# Patient Record
Sex: Male | Born: 1955
Health system: Southern US, Community
[De-identification: ages and names within clinical notes are randomized; demographics above are authoritative.]

## PROBLEM LIST (undated history)

## (undated) ENCOUNTER — Emergency Department (HOSPITAL_COMMUNITY): Admission: EM | Payer: Self-pay | Source: Home / Self Care

## (undated) DIAGNOSIS — E119 Type 2 diabetes mellitus without complications: Secondary | ICD-10-CM

## (undated) DIAGNOSIS — I10 Essential (primary) hypertension: Secondary | ICD-10-CM

## (undated) DIAGNOSIS — E7800 Pure hypercholesterolemia, unspecified: Secondary | ICD-10-CM

---

## 1998-08-29 ENCOUNTER — Encounter: Payer: Self-pay | Admitting: Family Medicine

## 1998-08-29 ENCOUNTER — Ambulatory Visit (HOSPITAL_COMMUNITY): Admission: RE | Admit: 1998-08-29 | Discharge: 1998-08-29 | Payer: Self-pay | Admitting: Family Medicine

## 2002-04-11 ENCOUNTER — Encounter: Admission: RE | Admit: 2002-04-11 | Discharge: 2002-07-10 | Payer: Self-pay | Admitting: Family Medicine

## 2003-01-26 ENCOUNTER — Emergency Department (HOSPITAL_COMMUNITY): Admission: EM | Admit: 2003-01-26 | Discharge: 2003-01-26 | Payer: Self-pay | Admitting: Emergency Medicine

## 2003-10-09 ENCOUNTER — Ambulatory Visit (HOSPITAL_COMMUNITY): Admission: RE | Admit: 2003-10-09 | Discharge: 2003-10-09 | Payer: Self-pay | Admitting: Family Medicine

## 2003-10-30 ENCOUNTER — Ambulatory Visit (HOSPITAL_COMMUNITY): Admission: RE | Admit: 2003-10-30 | Discharge: 2003-10-30 | Payer: Self-pay | Admitting: Family Medicine

## 2014-08-02 ENCOUNTER — Encounter (HOSPITAL_COMMUNITY): Payer: Self-pay | Admitting: Emergency Medicine

## 2014-08-02 ENCOUNTER — Emergency Department (HOSPITAL_COMMUNITY)
Admission: EM | Admit: 2014-08-02 | Discharge: 2014-08-02 | Disposition: A | Discharge status: Home / Self Care | Payer: BLUE CROSS/BLUE SHIELD | Attending: Emergency Medicine | Admitting: Emergency Medicine

## 2014-08-02 ENCOUNTER — Emergency Department (HOSPITAL_COMMUNITY): Payer: BLUE CROSS/BLUE SHIELD

## 2014-08-02 DIAGNOSIS — E119 Type 2 diabetes mellitus without complications: Secondary | ICD-10-CM | POA: Diagnosis not present

## 2014-08-02 DIAGNOSIS — R109 Unspecified abdominal pain: Secondary | ICD-10-CM | POA: Diagnosis present

## 2014-08-02 DIAGNOSIS — R1110 Vomiting, unspecified: Secondary | ICD-10-CM

## 2014-08-02 DIAGNOSIS — R112 Nausea with vomiting, unspecified: Secondary | ICD-10-CM | POA: Diagnosis not present

## 2014-08-02 DIAGNOSIS — I10 Essential (primary) hypertension: Secondary | ICD-10-CM | POA: Insufficient documentation

## 2014-08-02 DIAGNOSIS — R197 Diarrhea, unspecified: Secondary | ICD-10-CM | POA: Diagnosis not present

## 2014-08-02 DIAGNOSIS — D72829 Elevated white blood cell count, unspecified: Secondary | ICD-10-CM | POA: Diagnosis not present

## 2014-08-02 DIAGNOSIS — R1084 Generalized abdominal pain: Secondary | ICD-10-CM | POA: Insufficient documentation

## 2014-08-02 DIAGNOSIS — R000 Tachycardia, unspecified: Secondary | ICD-10-CM | POA: Diagnosis not present

## 2014-08-02 HISTORY — DX: Essential (primary) hypertension: I10

## 2014-08-02 HISTORY — DX: Type 2 diabetes mellitus without complications: E11.9

## 2014-08-02 HISTORY — DX: Pure hypercholesterolemia, unspecified: E78.00

## 2014-08-02 LAB — COMPREHENSIVE METABOLIC PANEL
ALBUMIN: 4.8 g/dL (ref 3.5–5.0)
ALK PHOS: 56 U/L (ref 38–126)
ALT: 30 U/L (ref 17–63)
AST: 23 U/L (ref 15–41)
Anion gap: 17 — ABNORMAL HIGH (ref 5–15)
BILIRUBIN TOTAL: 1.8 mg/dL — AB (ref 0.3–1.2)
BUN: 27 mg/dL — ABNORMAL HIGH (ref 6–20)
CO2: 19 mmol/L — ABNORMAL LOW (ref 22–32)
Calcium: 9.3 mg/dL (ref 8.9–10.3)
Chloride: 101 mmol/L (ref 101–111)
Creatinine, Ser: 0.8 mg/dL (ref 0.61–1.24)
GFR calc Af Amer: >60 mL/min (ref >60)
GLUCOSE: 260 mg/dL — AB (ref 65–99)
Potassium: 3.3 mmol/L — ABNORMAL LOW (ref 3.5–5.1)
SODIUM: 137 mmol/L (ref 135–145)
Total Protein: 7.9 g/dL (ref 6.5–8.1)

## 2014-08-02 LAB — CBC WITH DIFFERENTIAL/PLATELET
Basophils Absolute: 0 10*3/uL (ref 0.0–0.1)
Basophils Relative: 0 % (ref 0–1)
EOS PCT: 0 % (ref 0–5)
Eosinophils Absolute: 0 10*3/uL (ref 0.0–0.7)
HEMATOCRIT: 42.5 % (ref 39.0–52.0)
Hemoglobin: 15.4 g/dL (ref 13.0–17.0)
LYMPHS PCT: 3 % — AB (ref 12–46)
Lymphs Abs: 0.6 10*3/uL — ABNORMAL LOW (ref 0.7–4.0)
MCH: 28.6 pg (ref 26.0–34.0)
MCHC: 36.2 g/dL — ABNORMAL HIGH (ref 30.0–36.0)
MCV: 79 fL (ref 78.0–100.0)
MONO ABS: 0.5 10*3/uL (ref 0.1–1.0)
Monocytes Relative: 3 % (ref 3–12)
NEUTROS ABS: 19 10*3/uL — AB (ref 1.7–7.7)
Neutrophils Relative %: 94 % — ABNORMAL HIGH (ref 43–77)
Platelets: 259 10*3/uL (ref 150–400)
RBC: 5.38 MIL/uL (ref 4.22–5.81)
RDW: 13.7 % (ref 11.5–15.5)
WBC: 20.1 10*3/uL — AB (ref 4.0–10.5)

## 2014-08-02 LAB — I-STAT TROPONIN, ED
Troponin i, poc: 0.01 ng/mL (ref 0.00–0.08)
Troponin i, poc: 0.01 ng/mL (ref 0.00–0.08)

## 2014-08-02 LAB — I-STAT CG4 LACTIC ACID, ED
LACTIC ACID, VENOUS: 1.66 mmol/L (ref 0.5–2.0)
Lactic Acid, Venous: 2.51 mmol/L (ref 0.5–2.0)

## 2014-08-02 LAB — CBG MONITORING, ED
Glucose-Capillary: 226 mg/dL — ABNORMAL HIGH (ref 65–99)
Glucose-Capillary: 171 mg/dL — ABNORMAL HIGH (ref 65–99)

## 2014-08-02 LAB — LIPASE, BLOOD: LIPASE: 22 U/L (ref 22–51)

## 2014-08-02 MED ORDER — SODIUM CHLORIDE 0.9 % IV BOLUS (SEPSIS)
1000.0000 mL | Freq: Once | INTRAVENOUS | Status: AC
  Administered 2014-08-02: 1000 mL via INTRAVENOUS

## 2014-08-02 MED ORDER — ONDANSETRON HCL 4 MG PO TABS: 4.0000 mg | ORAL_TABLET | Freq: Four times a day (QID) | ORAL | Status: DC

## 2014-08-02 MED ORDER — ONDANSETRON HCL 4 MG/2ML IJ SOLN
4.0000 mg | Freq: Once | INTRAMUSCULAR | Status: AC
  Administered 2014-08-02: 4 mg via INTRAVENOUS
  Filled 2014-08-02: qty 2

## 2014-08-02 MED ORDER — IOHEXOL 300 MG/ML  SOLN
100.0000 mL | Freq: Once | INTRAMUSCULAR | Status: AC | PRN
  Administered 2014-08-02: 100 mL via INTRAVENOUS

## 2014-08-02 MED ORDER — IOHEXOL 300 MG/ML  SOLN
50.0000 mL | Freq: Once | INTRAMUSCULAR | Status: AC | PRN
  Administered 2014-08-02: 50 mL via ORAL

## 2014-08-02 MED ORDER — HYDROMORPHONE HCL 1 MG/ML IJ SOLN
1.0000 mg | Freq: Once | INTRAMUSCULAR | Status: AC
  Administered 2014-08-02: 1 mg via INTRAVENOUS
  Filled 2014-08-02: qty 1

## 2014-08-02 MED ORDER — HYDROCODONE-ACETAMINOPHEN 5-325 MG PO TABS: 1.0000 | ORAL_TABLET | Freq: Four times a day (QID) | ORAL | Status: DC | PRN

## 2014-08-02 NOTE — ED Notes (Signed)
Family at bedside. 

## 2014-08-02 NOTE — ED Provider Notes (Signed)
CSN: [id number]     Arrival date & time 08/02/14  1209 History   First MD Initiated Contact with Patient 08/02/14 1228     Chief Complaint  Patient presents with  . Abdominal Pain  . Diarrhea     Patient is a 59 y.o. male presenting with abdominal pain and diarrhea. The history is provided by the patient. No language interpreter was used.  Abdominal Pain Associated symptoms: diarrhea   Diarrhea Associated symptoms: abdominal pain    Mr. Cornia presents for evaluation of abdominal pain, vomiting, diarrhea. Symptoms started at 6 AM this morning. They'll started some time. He reports multiple episodes of diarrhea, 6+. His associated nausea, vomiting, dry heaves. He has diffuse crampy abdominal pain. No history of similar previous episodes. He is an occasional drinker. History of hypertension and diabetes. Symptoms are severe, constant, worsening.  Past Medical History  Diagnosis Date  . Diabetes mellitus without complication   . Hypertension   . Hypercholesterolemia    History reviewed. No pertinent past surgical history. History reviewed. No pertinent family history. History  Substance Use Topics  . Smoking status: Never Smoker   . Smokeless tobacco: Not on file  . Alcohol Use: No    Review of Systems  Gastrointestinal: Positive for abdominal pain and diarrhea.  All other systems reviewed and are negative.     Allergies  Review of patient's allergies indicates no known allergies.  Home Medications   Prior to Admission medications   Not on File   BP 150/71 mmHg  Pulse 120  Temp(Src) 98.4 F (36.9 C) (Oral)  Resp 18  SpO2 100% Physical Exam  Constitutional: He is oriented to person, place, and time. He appears well-developed and well-nourished. He appears distressed.  HENT:  Head: Normocephalic and atraumatic.  Cardiovascular: Regular rhythm.   tachycardic  Pulmonary/Chest: Effort normal. No respiratory distress.  Abdominal: Soft.  Moderate diffuse abdominal  tenderness  Musculoskeletal: He exhibits no edema or tenderness.  Neurological: He is alert and oriented to person, place, and time.  Skin: Skin is warm and dry.  Psychiatric: He has a normal mood and affect. His behavior is normal.  Nursing note and vitals reviewed.   ED Course  Procedures (including critical care time) Labs Review Labs Reviewed  COMPREHENSIVE METABOLIC PANEL - Abnormal; Notable for the following:    Potassium 3.3 (*)    CO2 19 (*)    Glucose, Bld 260 (*)    BUN 27 (*)    Total Bilirubin 1.8 (*)    Anion gap 17 (*)    All other components within normal limits  CBC WITH DIFFERENTIAL/PLATELET - Abnormal; Notable for the following:    WBC 20.1 (*)    MCHC 36.2 (*)    Neutrophils Relative % 94 (*)    Neutro Abs 19.0 (*)    Lymphocytes Relative 3 (*)    Lymphs Abs 0.6 (*)    All other components within normal limits  I-STAT CG4 LACTIC ACID, ED - Abnormal; Notable for the following:    Lactic Acid, Venous 2.51 (*)    All other components within normal limits  CBG MONITORING, ED - Abnormal; Notable for the following:    Glucose-Capillary 226 (*)    All other components within normal limits  CBG MONITORING, ED - Abnormal; Notable for the following:    Glucose-Capillary 171 (*)    All other components within normal limits  LIPASE, BLOOD  I-STAT TROPOININ, ED  I-STAT CG4 LACTIC ACID, ED  I-STAT  TROPOININ, ED    Imaging Review Ct Abdomen Pelvis W Contrast  08/02/2014   CLINICAL DATA:  59 year old male with generalized abdominal pain, nausea and vomiting since 06:30 this morning.  EXAM: CT ABDOMEN AND PELVIS WITH CONTRAST  TECHNIQUE: Multidetector CT imaging of the abdomen and pelvis was performed using the standard protocol following bolus administration of intravenous contrast.  CONTRAST:  68mL OMNIPAQUE IOHEXOL 300 MG/ML SOLN, OMNIPAQUE IOHEXOL 300 MG/ML SOLN  COMPARISON:  No priors.  FINDINGS: Lower chest: Atherosclerotic calcifications in the left  circumflex and right coronary arteries.  Hepatobiliary: Mild diffuse decreased attenuation throughout the hepatic parenchyma, suggestive of hepatic steatosis (difficult to say for certain on today's noncontrast CT examination). No discrete cystic or solid hepatic lesions. No intra or extrahepatic biliary ductal dilatation. Gallbladder is normal in appearance.  Pancreas: No pancreatic mass. No pancreatic ductal dilatation. No pancreatic or peripancreatic fluid or inflammatory changes.  Spleen: Unremarkable.  Adrenals/Urinary Tract: 2.9 x 2.0 cm fatty attenuation lesion in the medial limb of the right adrenal gland, compatible with an adrenal myeloipoma. Left adrenal gland and bilateral kidneys are normal in appearance. No hydroureteronephrosis. Urinary bladder is normal in appearance.  Stomach/Bowel: The appearance of the stomach is normal. No pathologic dilatation of small bowel or colon. Normal appendix.  Vascular/Lymphatic: Atherosclerosis throughout the abdominal and pelvic vasculature, without evidence of aneurysm or dissection. No lymphadenopathy noted in the abdomen or pelvis.  Reproductive: Prostate gland and seminal vesicles are unremarkable in appearance.  Other: No significant volume of ascites.  No pneumoperitoneum.  Musculoskeletal: There are no aggressive appearing lytic or blastic lesions noted in the visualized portions of the skeleton.  IMPRESSION: 1. No acute findings in the abdomen or pelvis to account for the patient's symptoms. 2. Normal appendix. 3. Mild diffuse decreased attenuation throughout the hepatic parenchyma, suggestive of hepatic steatosis (difficult to confirm on today's contrast enhanced CT examination). 4. 2.9 x 2.0 cm fatty attenuation lesion associated with the right adrenal gland is compatible with a benign adrenal myelolipoma.   Electronically Signed   By: Trudie Reed M.D.   On: 08/02/2014 15:09     EKG Interpretation   Date/Time:  Thursday August 02 2014 13:04:30  EDT Ventricular Rate:  99 PR Interval:  168 QRS Duration: 100 QT Interval:  404 QTC Calculation: 518 R Axis:   12 Text Interpretation:  Sinus rhythm Probable left atrial enlargement RSR'  in V1 or V2, probably normal variant Nonspecific T abnormalities, diffuse  leads Prolonged QT interval Confirmed by Lincoln Brigham [phone]) on 08/02/2014  1:19:25 PM      MDM   Final diagnoses:  Abdominal pain, vomiting, and diarrhea  Leukocytosis    Patient here for evaluation of abdominal pain, vomiting, diarrhea. On repeat evaluation after fluids, pain meds, antiemetics he feels much improved. Repeat abdominal examination is benign. CT abdomen without any evidence of acute abnormalities. Current clinical picture is not consistent with cholecystitis, biliary colic, dissection. Discussed with patient unclear source of vomiting and diarrhea and recommend close return precautions. Discussed recommend repeat PCP evaluation given leukocytosis and CT incidental findings. Also discussed with patient findings of coronary artery disease on his CT abdomen and EKG with nonspecific changes. Plan to follow up with cardiology as an outpatient. Discussed continuing his home aspirin therapy and very close return precautions if he develops any new symptoms. Patient is tolerating oral fluids in the department without difficulty.    Tilden Fossa, MD 08/03/14 [phone]

## 2014-08-02 NOTE — ED Notes (Signed)
Pt escorted to discharge window. Pt verbalized understanding discharge instructions. In no acute distress.  

## 2014-08-02 NOTE — ED Notes (Signed)
Pt c/o gen abd pain with NVD since 0630 this morning.  States that he is now just dry heaving.  States he is a T2 diabetic.

## 2014-08-02 NOTE — ED Notes (Signed)
Vital signs stable. 

## 2014-08-02 NOTE — ED Notes (Signed)
Notified nurse of critical lab results 

## 2014-08-02 NOTE — Progress Notes (Signed)
EDCM spoke to patient and family member at bedside.  Patient confirms his pcp is Dr. Holley Bouche of [facility].  System updated.

## 2014-08-02 NOTE — Discharge Instructions (Signed)
Your CT scan showed hardening of the arteries in your heart (coronary artery disease).  Please follow up with the Cardiologist for further evaluation.  Please follow up with your family doctor for recheck.    Abdominal Pain Many things can cause abdominal pain. Usually, abdominal pain is not caused by a disease and will improve without treatment. It can often be observed and treated at home. Your health care provider will do a physical exam and possibly order blood tests and X-rays to help determine the seriousness of your pain. However, in many cases, more time must pass before a clear cause of the pain can be found. Before that point, your health care provider may not know if you need more testing or further treatment. HOME CARE INSTRUCTIONS  Monitor your abdominal pain for any changes. The following actions may help to alleviate any discomfort you are experiencing:  Only take over-the-counter or prescription medicines as directed by your health care provider.  Do not take laxatives unless directed to do so by your health care provider.  Try a clear liquid diet (broth, tea, or water) as directed by your health care provider. Slowly move to a bland diet as tolerated. SEEK MEDICAL CARE IF:  You have unexplained abdominal pain.  You have abdominal pain associated with nausea or diarrhea.  You have pain when you urinate or have a bowel movement.  You experience abdominal pain that wakes you in the night.  You have abdominal pain that is worsened or improved by eating food.  You have abdominal pain that is worsened with eating fatty foods.  You have a fever. SEEK IMMEDIATE MEDICAL CARE IF:   Your pain does not go away within 2 hours.  You keep throwing up (vomiting).  Your pain is felt only in portions of the abdomen, such as the right side or the left lower portion of the abdomen.  You pass bloody or black tarry stools. MAKE SURE YOU:  Understand these instructions.   Will  watch your condition.   Will get help right away if you are not doing well or get worse.  Document Released: 11/12/2004 Document Revised: 02/07/2013 Document Reviewed: 10/12/2012 [facility] Patient Information 2015 [place], [place]. This information is not intended to replace advice given to you by your health care provider. Make sure you discuss any questions you have with your health care provider. Atherosclerosis Atherosclerosis, or hardening of the arteries, is the buildup of plaque within the major arteries in the body. Plaque is made up of fats (lipids), cholesterol, calcium, and fibrous tissue. Plaque can narrow or block blood flow within an artery. Plaque can break off and cause damage to the affected organ. Plaque can also "rupture." When plaque ruptures within an artery, a clot can form, causing a sudden (acute) blockage of the artery. Untreated atherosclerosis can cause serious health problems or death.  RISK FACTORS  High cholesterol levels.  Smoking.  Obesity.  Lack of activity or exercise.  Eating a diet high in saturated fat.  Family history.  Diabetes. SIGNS AND SYMPTOMS  Symptoms of atherosclerosis can occur when blood flow to an artery is slowed or blocked. Severity and onset of symptoms depends on how extensive the narrowing or blockage is. A sudden plaque rupture can bring immediate, life-threatening symptoms. Atherosclerosis can affect different arteries in the body, for example:  Coronary arteries. The coronary arteries supply the heart with blood. When the coronary arteries are narrowed or blocked from atherosclerosis, this is known as coronary artery disease (  CAD). CAD can cause a heart attack. Common heart attack symptoms include:  Chest pain or pain that radiates to the neck, arm, jaw, or in the upper, middle back (mid-scapular pain).  Shortness of breath without cause.  Profuse sweating while at rest.  Irregular heartbeats.  Nausea or gastrointestinal  upset.  Carotid arteries. The carotid arteries supply the brain with blood. They are located on each side of your neck. When blood flow to these arteries is slowed or blocked, a transient ischemic attack (TIA) or stroke can occur. A TIA is considered a "mini-stroke" or "warning stroke." TIA symptoms are the same as stroke symptoms, but they are temporary and last less than 24 hours. A stroke can cause permanent damage or death. Common TIA and stroke symptoms include:  Sudden numbness or weakness to one side of your body, such as the face, arm, or leg.  Sudden confusion or trouble speaking or understanding.  Sudden trouble seeing out of one or both eyes.  Sudden trouble walking, loss of balance, or dizziness.  Sudden, severe headache with no known cause.  Arteries in the legs. When arteries in the lower legs become narrowed or blocked, this is known as peripheral vascular disease (PVD). PVD can cause a symptom called claudication. Claudication is pain or a burning feeling in your legs when walking or exercising and usually goes away with rest. Very severe PVD can cause pain in your legs while at rest.  Renal arteries. The renal arteries supply the kidneys with blood. Blockage of the renal arteries can cause a decline in kidney function or high blood pressure (hypertension).  Gastrointestinal arteries (mesenteric circulation). Abdominal pain may occur after eating. DIAGNOSIS  Your health care provider may perform the following tests to diagnose atherosclerosis:  Blood tests.  Stress test.  Echocardiogram.  Nuclear scan.  Ankle/brachial index.  Ultrasonography.  Computed tomography (CT) scan.  Angiography. TREATMENT  Atherosclerosis treatment includes the following:  Lifestyle changes such as:  Quitting smoking. Your health care provider can help you with smoking cessation.  Eating a diet low in saturated fat. A registered dietitian can educate you on healthy food options,  such as helping you understand the difference between good fat and bad fat.  Following an exercise program approved by your health care provider.  Maintaining a healthy weight. Lose weight as approved by your health care provider.  Have your cholesterol levels checked as directed by your health care provider.  Medicines. Cholesterol medicines can help slow or stop the progression of atherosclerosis.  Different procedural or surgical interventions to treat atherosclerosis include:  Balloon angioplasty. The technical name for balloon angioplasty is percutaneous transluminal angioplasty (PTA). In this procedure, a catheter with a small balloon at the tip is inserted through the blocked or narrowed artery. The balloon is then inflated. When the balloon is inflated, the fatty plaque is compressed against the artery wall, allowing better blood flow within the artery.  Balloon angioplasty and stenting. In this procedure, balloon angioplasty is combined with a stenting procedure. A stent is a small, metal mesh tube that keeps the artery open. After the artery is opened up by the balloon technique, the stent is then deployed. The stent is permanent.  Open heart surgery or bypass surgery. To perform this type of surgery, a healthy vessel is first "harvested" from either the leg or arm. The harvested vessel is then used to "bypass" the blocked atherosclerotic vessel so new blood flow can be established.  Atherectomy. Atherectomy is a procedure  that uses a catheter with a sharp blade to remove plaque from an artery. A chamber in the catheter collects the plaque.  Endarterectomy. An endarterectomy is a surgical procedure where a surgeon removes plaque from an artery.  Amputation. When blockages in the lower legs are very severe and circulation cannot be restored, amputation may be required. SEEK IMMEDIATE MEDICAL CARE IF:  You are having heart attack symptoms, such as:  Chest pain or pain that radiates  to the neck, arm, jaw, or in the upper, middle back (mid-scapular pain).  Shortness of breath without cause.  Profuse sweating while at rest.  Irregular heartbeats.  Nausea or gastrointestinal upset.  You are having stroke symptoms, such as sudden:  Numbness or weakness to one side of your body, such as the face, arm, or leg.  Confusion or trouble speaking or understanding.  Trouble seeing out of one or both eyes.  Trouble walking, loss of balance, or dizziness.  Severe headache with no known cause.  Your hands or feet are bluish, cold, or you have pain in them.  You have bad abdominal pain after eating. Symptoms of heart attack or stroke may represent a serious problem that is an emergency. Do not wait to see if the symptoms will go away. Get medical help right away. Call your local emergency services (911 in the [place] Do not drive yourself to the hospital. Document Released: 04/25/2003 Document Revised: 06/19/2013 Document Reviewed: 04/07/2011 [facility] Patient Information 2015 [place], [place]. This information is not intended to replace advice given to you by your health care provider. Make sure you discuss any questions you have with your health care provider.

## 2014-10-26 ENCOUNTER — Telehealth: Payer: Self-pay | Admitting: Cardiovascular Disease

## 2014-10-26 NOTE — Telephone Encounter (Signed)
Received records from [facility] for appointment on 10/30/14 with Dr Kelly.  Records given to N Hines (medical records) for Dr Kelly's schedule on 10/30/14. lp °

## 2014-10-30 ENCOUNTER — Encounter: Payer: Self-pay | Admitting: Cardiovascular Disease

## 2014-10-30 ENCOUNTER — Ambulatory Visit (INDEPENDENT_AMBULATORY_CARE_PROVIDER_SITE_OTHER): Payer: BLUE CROSS/BLUE SHIELD | Admitting: Cardiovascular Disease

## 2014-10-30 VITALS — BP 150/72 | HR 97 | Ht 72.0 in | Wt 226.0 lb

## 2014-10-30 DIAGNOSIS — E785 Hyperlipidemia, unspecified: Secondary | ICD-10-CM | POA: Diagnosis not present

## 2014-10-30 DIAGNOSIS — I10 Essential (primary) hypertension: Secondary | ICD-10-CM

## 2014-10-30 DIAGNOSIS — E119 Type 2 diabetes mellitus without complications: Secondary | ICD-10-CM

## 2014-10-30 DIAGNOSIS — R011 Cardiac murmur, unspecified: Secondary | ICD-10-CM

## 2014-10-30 DIAGNOSIS — I2510 Atherosclerotic heart disease of native coronary artery without angina pectoris: Secondary | ICD-10-CM

## 2014-10-30 MED ORDER — METOPROLOL SUCCINATE ER 50 MG PO TB24: 50.0000 mg | ORAL_TABLET | Freq: Every day | ORAL | Status: DC

## 2014-10-30 NOTE — Patient Instructions (Signed)
Your physician has recommended you make the following change in your medication: start new prescription for metoprolol succ 50 mg . Only take 1/2 tablet daily until your follow up appointment.  Your physician has requested that you have an echocardiogram. Echocardiography is a painless test that uses sound waves to create images of your heart. It provides your doctor with information about the size and shape of your heart and how well your heart's chambers and valves are working. This procedure takes approximately one hour. There are no restrictions for this procedure.   Your physician has requested that you have en exercise stress myoview. For further information please visit [email or web address] Please follow instruction sheet, as given.  Your physician recommends that you schedule a follow-up appointment in: 4 weeks with Dr. Tresa Endo.

## 2014-10-31 ENCOUNTER — Encounter: Payer: Self-pay | Admitting: Cardiovascular Disease

## 2014-10-31 DIAGNOSIS — E119 Type 2 diabetes mellitus without complications: Secondary | ICD-10-CM | POA: Insufficient documentation

## 2014-10-31 DIAGNOSIS — I10 Essential (primary) hypertension: Secondary | ICD-10-CM | POA: Insufficient documentation

## 2014-10-31 DIAGNOSIS — I2510 Atherosclerotic heart disease of native coronary artery without angina pectoris: Secondary | ICD-10-CM | POA: Insufficient documentation

## 2014-10-31 DIAGNOSIS — E785 Hyperlipidemia, unspecified: Secondary | ICD-10-CM | POA: Insufficient documentation

## 2014-10-31 NOTE — Progress Notes (Signed)
Patient ID: Mike Taylor, male   DOB: [birth year], 59 y.o.   MRN: [id number]     Primary MD: Dr. Shirline Frees  PATIENT PROFILE: Mike Taylor is a 59 y.o. male who is referred through the courtesy of Dr. Kenton Kingfisher for cardiology evaluation following detection of vascular calcification.   HPI:  Mike Taylor has a 10 year history of diabetes mellitus, a 15 year history of hypertension, as well as a history of hyperlipidemia.  Most recently he has been on losartan HCT 100/25 mg, prazosin 5 mg for hypertension.  He has been on interval, 300 mg and chlamydia per minute.  4 mg in addition to metformin for diabetes mellitus.  He has been on Lipitor 40 mg for hyperlipidemia.  He also has a peripheral neuropathy for which he takes gabapentin.  Mike Taylor has long work days.  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  Weekly is sleeping only 4 hours per night.  In June 2016.  He had an episode of gastroenteritis.  He was evaluated in the emergency room and a CT of his abdomen was obtained which did not show any acute abnormality.  However, he was noted to have calcifications throughout his coronary arteries, aorta and pelvic arteries.  There was no detection of any aneurysm.  Because of these findings and a light of his risk factors.  He is referred for cardiology evaluation.  Past Medical History  Diagnosis Date  . Hypercholesterolemia   . Diabetes mellitus without complication   . Hypertension     History reviewed. No pertinent past surgical history.  No Known Allergies  Current Outpatient Prescriptions  Medication Sig Dispense Refill  . aspirin EC 81 MG tablet Take 81 mg by mouth daily with breakfast.    . atorvastatin (LIPITOR) 40 MG tablet Take 40 mg by mouth every evening.    . canagliflozin (INVOKANA) 300 MG TABS tablet Take 300 mg by mouth daily  before breakfast.    . gabapentin (NEURONTIN) 300 MG capsule Take 300 mg by mouth 3 (three) times daily.    Marland Kitchen glimepiride (AMARYL) 4 MG tablet Take 4 mg by mouth 2 (two) times daily.    . lansoprazole (PREVACID) 30 MG capsule Take 30 mg by mouth daily with breakfast.    . losartan-hydrochlorothiazide (HYZAAR) 100-25 MG per tablet Take 1 tablet by mouth daily with breakfast.    . metFORMIN (GLUCOPHAGE) 1000 MG tablet Take 1,000 mg by mouth 2 (two) times daily with a meal.    . terazosin (HYTRIN) 5 MG capsule Take 5 mg by mouth daily with breakfast.    . metoprolol succinate (TOPROL XL) 50 MG 24 hr tablet Take 1 tablet (50 mg total) by mouth daily. Take with or immediately following a meal. 30 tablet 6   No current facility-administered medications for this visit.    Social History   Social History  . Marital Status: Single    Spouse Name: N/A  . Number of Children: N/A  . Years of Education: N/A   Occupational History  . Not on file.   Social History Main Topics  . Smoking status: Never Smoker   . Smokeless tobacco: Not on file  . Alcohol Use: No  . Drug Use: No  . Sexual Activity: Not on file   Other  Topics Concern  . Not on file   Social History Narrative   Socially he is married for 30 years.  He has 2 children.  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  Remotely he had smoked but quit smoking in 1990.  He does not drink alcohol.  He does not have the time to do any exercise.  Family History  Problem Relation Age of Onset  . Stroke Mother   . Diabetes Mother   . Cancer - Lung Father   . Heart attack Maternal Grandmother   . Leukemia Maternal Grandfather   . Stroke Paternal Grandmother    Additional family history is that his mother died at age 16 and suffered a heart attack at age 36 and a stroke at age 46.  Father had heart disease and died of lung cancer.   ROS General: Negative; No fevers, chills, or night  sweats HEENT: Negative; No changes in vision or hearing, sinus congestion, difficulty swallowing Pulmonary: Negative; No cough, wheezing, shortness of breath, hemoptysis Cardiovascular:  See HPI; No chest pain, presyncope, syncope, palpitations, edema GI: Negative; No nausea, vomiting, diarrhea, or abdominal pain GU: Negative; No dysuria, hematuria, or difficulty voiding Musculoskeletal: Negative; no myalgias, joint pain, or weakness Hematologic/Oncologic: Negative; no easy bruising, bleeding Endocrine: Negative; no heat/cold intolerance; no diabetes Neuro: Negative; no changes in balance, headaches Skin: Negative; No rashes or skin lesions Psychiatric: Negative; No behavioral problems, depression Sleep: Negative; No daytime sleepiness, hypersomnolence, bruxism, restless legs, hypnogagnic hallucinations Other comprehensive 14 point system review is negative   Physical Exam BP 150/72 mmHg  Pulse 97  Ht 6' (1.829 m)  Wt 226 lb (102.513 kg)  BMI 30.64 kg/m2  Wt Readings from Last 3 Encounters:  10/30/14 226 lb (102.513 kg)   General: Alert, oriented, no distress.  Skin: normal turgor, no rashes, warm and dry HEENT: Normocephalic, atraumatic. Pupils equal round and reactive to light; sclera anicteric; extraocular muscles intact; Fundi without hemorrhages or exudates. Nose without nasal septal hypertrophy Mouth/Parynx benign; Mallinpatti scale 3 Neck: No JVD, no carotid bruits; normal carotid upstroke Lungs: clear to ausculatation and percussion; no wheezing or rales Chest wall: without tenderness to palpitation Heart: PMI not displaced, RRR, s1 s2 normal, 2/6 systolic murmur, trace diastolic murmur, no rubs, gallops, thrills, or heaves Abdomen: soft, nontender; no hepatosplenomehaly, BS+; abdominal aorta nontender and not dilated by palpation. Back: no CVA tenderness Pulses 2+ Musculoskeletal: full range of motion, normal strength, no joint deformities Extremities: no clubbing  cyanosis or edema, Homan's sign negative  Neurologic: grossly nonfocal; Cranial nerves grossly wnl Psychologic: Normal mood and affect   ECG (independently read by me): Normal sinus rhythm at 97 bpm.  Nondiagnostic T-wave abnormalities inferolaterally.  LABS:  BMP Latest Ref Rng 08/02/2014  Glucose 65 - 99 mg/dL 260(H)  BUN 6 - 20 mg/dL 27(H)  Creatinine 0.61 - 1.24 mg/dL 0.80  Sodium 135 - 145 mmol/L 137  Potassium 3.5 - 5.1 mmol/L 3.3(L)  Chloride 101 - 111 mmol/L 101  CO2 22 - 32 mmol/L 19(L)  Calcium 8.9 - 10.3 mg/dL 9.3     Hepatic Function Latest Ref Rng 08/02/2014  Total Protein 6.5 - 8.1 g/dL 7.9  Albumin 3.5 - 5.0 g/dL 4.8  AST 15 - 41 U/L 23  ALT 17 - 63 U/L 30  Alk Phosphatase 38 - 126 U/L 56  Total Bilirubin 0.3 - 1.2 mg/dL 1.8(H)    CBC Latest  Ref Rng 08/02/2014  WBC 4.0 - 10.5 K/uL 20.1(H)  Hemoglobin 13.0 - 17.0 g/dL 15.4  Hematocrit 39.0 - 52.0 % 42.5  Platelets 150 - 400 K/uL 259   Lab Results  Component Value Date   MCV 79.0 08/02/2014   No results found for: TSH No results found for: HGBA1C   BNP No results found for: BNP  ProBNP No results found for: PROBNP   Lipid Panel  No results found for: CHOL, TRIG, HDL, CHOLHDL, VLDL, LDLCALC, LDLDIRECT  RADIOLOGY: No results found.   ASSESSMENT AND PLAN: Mike Taylor is a 59 year old male whose cardiac risk factors include hypertension, type 2 diabetes mellitus, hyperlipidemia, and family history for CAD.  He has recently been found to have calcification involving his coronary arteries, abdominal aorta and pelvic arteries.  His ECG shows nondiagnostic T-wave changes and mild RV conduction delay.  He is asymptomatic with reference to chest pain.  His blood pressure today was increased at 150/72 and he had a borderline resting tachycardia with a heart rate approximately 100 bpm.  I believe he is sleep deprived, particularly as a result of his long working days.  We discussed potential increased  mortality associated with chronic sleep deprivation.  He has a cardiac murmur.  I will try to obtain any recent lab work that he may have received.  I am scheduling him for an echo Doppler study to further evaluate both systolic and diastolic function as well as valvular heart disease.  With his evidence for coronary calcification and risk factors, I am scheduling him for a nuclear stress test to be done as an exercise Myoview study.  I will start him on Toprol-XL initially at 25 mg daily for improved blood pressure control and rate control.  I will see him in 4-6 weeks for reevaluation.   Troy Sine, MD, [facility] 10/31/2014 7:57 PM

## 2014-11-12 ENCOUNTER — Encounter: Payer: Self-pay | Admitting: Cardiovascular Disease

## 2014-11-21 ENCOUNTER — Telehealth (HOSPITAL_COMMUNITY): Payer: Self-pay | Admitting: *Deleted

## 2014-11-21 NOTE — Telephone Encounter (Signed)
Patient given detailed instructions per Myocardial Perfusion Study Information Sheet for test on 11/21/14 at 9:15. Patient notified to arrive 15 minutes early and that it is imperative to arrive on time for appointment to keep from having the test rescheduled.  If you need to cancel or reschedule your appointment, please call the office within 24 hours of your appointment. Failure to do so may result in a cancellation of your appointment, and a $50 no show fee. Patient verbalized understanding. Antionette Char, RN

## 2014-11-23 ENCOUNTER — Ambulatory Visit (HOSPITAL_COMMUNITY): Payer: BLUE CROSS/BLUE SHIELD | Attending: Cardiovascular Disease

## 2014-11-23 ENCOUNTER — Other Ambulatory Visit: Payer: Self-pay

## 2014-11-23 ENCOUNTER — Ambulatory Visit (HOSPITAL_BASED_OUTPATIENT_CLINIC_OR_DEPARTMENT_OTHER): Payer: BLUE CROSS/BLUE SHIELD

## 2014-11-23 DIAGNOSIS — I517 Cardiomegaly: Secondary | ICD-10-CM | POA: Diagnosis not present

## 2014-11-23 DIAGNOSIS — R011 Cardiac murmur, unspecified: Secondary | ICD-10-CM

## 2014-11-23 DIAGNOSIS — I10 Essential (primary) hypertension: Secondary | ICD-10-CM

## 2014-11-23 DIAGNOSIS — R0609 Other forms of dyspnea: Secondary | ICD-10-CM | POA: Diagnosis not present

## 2014-11-23 DIAGNOSIS — Z8249 Family history of ischemic heart disease and other diseases of the circulatory system: Secondary | ICD-10-CM | POA: Insufficient documentation

## 2014-11-23 DIAGNOSIS — Z87891 Personal history of nicotine dependence: Secondary | ICD-10-CM | POA: Diagnosis not present

## 2014-11-23 DIAGNOSIS — I2510 Atherosclerotic heart disease of native coronary artery without angina pectoris: Secondary | ICD-10-CM

## 2014-11-23 DIAGNOSIS — E785 Hyperlipidemia, unspecified: Secondary | ICD-10-CM | POA: Insufficient documentation

## 2014-11-23 DIAGNOSIS — E119 Type 2 diabetes mellitus without complications: Secondary | ICD-10-CM | POA: Diagnosis not present

## 2014-11-23 LAB — MYOCARDIAL PERFUSION IMAGING
CHL CUP NUCLEAR SDS: 1
CHL CUP RESTING HR STRESS: 77 {beats}/min
CHL RATE OF PERCEIVED EXERTION: 18
CSEPED: 6 min
CSEPEDS: 1 s
CSEPEW: 7 METS
LV sys vol: 43 mL
LVDIAVOL: 14 mL
MPHR: 162 {beats}/min
NUC STRESS TID: 0.81
Peak HR: 153 {beats}/min
Percent HR: 1 %
RATE: 0.36
SRS: 0
SSS: 1

## 2014-11-23 MED ORDER — TECHNETIUM TC 99M SESTAMIBI GENERIC - CARDIOLITE
30.0000 | Freq: Once | INTRAVENOUS | Status: AC | PRN
  Administered 2014-11-23: 30 via INTRAVENOUS

## 2014-11-23 MED ORDER — TECHNETIUM TC 99M SESTAMIBI GENERIC - CARDIOLITE
10.7000 | Freq: Once | INTRAVENOUS | Status: AC | PRN
  Administered 2014-11-23: 11 via INTRAVENOUS

## 2014-11-23 MED ORDER — REGADENOSON 0.4 MG/5ML IV SOLN: 0.4000 mg | Freq: Once | INTRAVENOUS | Status: AC

## 2014-12-21 ENCOUNTER — Encounter: Payer: Self-pay | Admitting: Cardiovascular Disease

## 2014-12-21 ENCOUNTER — Ambulatory Visit (INDEPENDENT_AMBULATORY_CARE_PROVIDER_SITE_OTHER): Payer: BLUE CROSS/BLUE SHIELD | Admitting: Cardiovascular Disease

## 2014-12-21 VITALS — BP 124/86 | HR 120 | Ht 72.0 in | Wt 234.3 lb

## 2014-12-21 DIAGNOSIS — I2510 Atherosclerotic heart disease of native coronary artery without angina pectoris: Secondary | ICD-10-CM

## 2014-12-21 DIAGNOSIS — E785 Hyperlipidemia, unspecified: Secondary | ICD-10-CM | POA: Diagnosis not present

## 2014-12-21 DIAGNOSIS — I10 Essential (primary) hypertension: Secondary | ICD-10-CM

## 2014-12-21 DIAGNOSIS — E119 Type 2 diabetes mellitus without complications: Secondary | ICD-10-CM

## 2014-12-21 DIAGNOSIS — I25810 Atherosclerosis of coronary artery bypass graft(s) without angina pectoris: Secondary | ICD-10-CM | POA: Diagnosis not present

## 2014-12-21 MED ORDER — ATORVASTATIN CALCIUM 80 MG PO TABS: 80.0000 mg | ORAL_TABLET | Freq: Every day | ORAL | Status: AC

## 2014-12-21 NOTE — Progress Notes (Signed)
Patient ID: Mike Taylor, male   DOB: [birth year], 59 y.o.   MRN: [id number]     Primary MD: Dr. Shirline Frees  PATIENT PROFILE: Mike Taylor is a 59 y.o. male who was initially referred through the courtesy of Dr. Kenton Kingfisher for cardiology evaluation following detection of vascular calcification.  He presents for two-month follow-up cardiology evaluation.   HPI:  Mike Taylor has a 10 year history of diabetes mellitus, a 15 year history of hypertension, as well as a history of hyperlipidemia.  Most recently he has been on losartan HCT 100/25 mg, prazosin 5 mg for hypertension.  He has been on interval, 300 mg and chlamydia per minute.  4 mg in addition to metformin for diabetes mellitus.  He has been on Lipitor 40 mg for hyperlipidemia.  He also has a peripheral neuropathy for which he takes gabapentin.  Mike Taylor has long work days.  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  Weekly is sleeping only 4 hours per night.  In June 2016 he was evaluated in the emergency room for gastroenteritis and a CT of his abdomen was obtained which did not show any acute abnormality.  However, he was noted to have calcifications throughout his coronary arteries, aorta and pelvic arteries.  There was no detection of any aneurysm.  Because of these findings and a light of his risk factors , he was referred to me and I saw him in September for initial evaluation.  At that time, his blood pressure was increased and he had a resting tachycardia of approximately 100 bpm..  I felt he also was sleep deprived with his minimal sleep duration.  I started him on Toprol-XL, initially at 25 mg.  He underwent an echo Doppler study which confirmed normal systolic function but there was evidence for mild LVH with grade 2 diastolic dysfunction.  There was mild right atrial dilatation.  He  underwent a nuclear perfusion study which was normal.  Post-rest ejection fraction was 61%.  Mike Taylor denies any chest pain.  He does admit to some anxiety.  With reference to his blood pressure.  He has been on Hytrin 5 mg, Toprol-XL 25 mg, losartan HCT 100/25 mg.  He is diabetic on Amaryl, Glucophage, and Actos.  He has been on Lipitor 40 mg daily.  At that dose for at least a year.  Laboratory by Dr. Kenton Kingfisher in September revealed a cholesterol of 168, triglycerides 108, LDL cholesterol 103, and HDL cholesterol 43.  He presents for follow-up cardiology evaluation.  Past Medical History  Diagnosis Date  . Hypercholesterolemia   . Diabetes mellitus without complication ([place])   . Hypertension     No past surgical history on file.  No Known Allergies  Current Outpatient Prescriptions  Medication Sig Dispense Refill  . aspirin EC 81 MG tablet Take 81 mg by mouth daily with breakfast.    . gabapentin (NEURONTIN) 300 MG capsule Take 300 mg by mouth 3 (three) times daily.    Marland Kitchen glimepiride (AMARYL) 4 MG tablet Take 4 mg by mouth 2 (two) times daily.    . lansoprazole (PREVACID) 30 MG capsule Take 30 mg by mouth daily with breakfast.    . losartan-hydrochlorothiazide (HYZAAR) 100-25 MG per tablet Take 1 tablet by mouth daily with breakfast.    .  metFORMIN (GLUCOPHAGE) 1000 MG tablet Take 1,000 mg by mouth 2 (two) times daily with a meal.    . metoprolol succinate (TOPROL XL) 50 MG 24 hr tablet Take 1 tablet (50 mg total) by mouth daily. Take with or immediately following a meal. 30 tablet 6  . pioglitazone (ACTOS) 15 MG tablet Take 1 tablet by mouth daily.  6  . terazosin (HYTRIN) 5 MG capsule Take 5 mg by mouth daily with breakfast.    . [DISCONTINUED] atorvastatin (LIPITOR) 40 MG tablet Take 40 mg by mouth every evening.     No current facility-administered medications for this visit.   Facility-Administered Medications Ordered in Other Visits  Medication Dose Route Frequency Provider Last  Rate Last Dose  . regadenoson (LEXISCAN) injection SOLN 0.4 mg  0.4 mg Intravenous Once Fay Records, MD        Social History   Social History  . Marital Status: Single    Spouse Name: N/A  . Number of Children: N/A  . Years of Education: N/A   Occupational History  . Not on file.   Social History Main Topics  . Smoking status: Never Smoker   . Smokeless tobacco: Not on file  . Alcohol Use: No  . Drug Use: No  . Sexual Activity: Not on file   Other Topics Concern  . Not on file   Social History Narrative   Socially he is married for 30 years.  He has 2 children.  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  Remotely he had smoked but quit smoking in 1990.  He does not drink alcohol.  He does not have the time to do any exercise.  Family History  Problem Relation Age of Onset  . Stroke Mother   . Diabetes Mother   . Cancer - Lung Father   . Heart attack Maternal Grandmother   . Leukemia Maternal Grandfather   . Stroke Paternal Grandmother    Additional family history is that his mother died at age 64 and suffered a heart attack at age 106 and a stroke at age 22.  Father had heart disease and died of lung cancer.   ROS General: Negative; No fevers, chills, or night sweats HEENT: Negative; No changes in vision or hearing, sinus congestion, difficulty swallowing Pulmonary: Negative; No cough, wheezing, shortness of breath, hemoptysis Cardiovascular:  See HPI; No chest pain, presyncope, syncope, palpitations, edema GI: Negative; No nausea, vomiting, diarrhea, or abdominal pain GU: Negative; No dysuria, hematuria, or difficulty voiding Musculoskeletal: Negative; no myalgias, joint pain, or weakness Hematologic/Oncologic: Negative; no easy bruising, bleeding Endocrine: Negative; no heat/cold intolerance; no diabetes Neuro: Negative; no changes in balance, headaches Skin: Negative; No rashes or skin lesions Psychiatric:  Negative; No behavioral problems, depression Sleep: Negative; No daytime sleepiness, hypersomnolence, bruxism, restless legs, hypnogagnic hallucinations Other comprehensive 14 point system review is negative   Physical Exam BP 124/86 mmHg  Pulse 120  Ht 6' (1.829 m)  Wt 234 lb 4.8 oz (106.278 kg)  BMI 31.77 kg/m2  Wt Readings from Last 3 Encounters:  12/21/14 234 lb 4.8 oz (106.278 kg)  11/23/14 226 lb (102.513 kg)  10/30/14 226 lb (102.513 kg)   General: Alert, oriented, no distress.  Skin: normal turgor, no rashes, warm and dry HEENT: Normocephalic, atraumatic. Pupils equal round and reactive to light; sclera anicteric; extraocular muscles intact; Fundi without hemorrhages or exudates. Nose without nasal septal hypertrophy Mouth/Parynx  benign; Mallinpatti scale 3 Neck: No JVD, no carotid bruits; normal carotid upstroke Lungs: clear to ausculatation and percussion; no wheezing or rales Chest wall: without tenderness to palpitation Heart: PMI not displaced, RRR, s1 s2 normal, 2/6 systolic murmur, trace diastolic murmur, no rubs, gallops, thrills, or heaves Abdomen: soft, nontender; no hepatosplenomehaly, BS+; abdominal aorta nontender and not dilated by palpation. Back: no CVA tenderness Pulses 2+ Musculoskeletal: full range of motion, normal strength, no joint deformities Extremities: no clubbing cyanosis or edema, Homan's sign negative  Neurologic: grossly nonfocal; Cranial nerves grossly wnl Psychologic: Normal mood and affect  ECG (independently read by me): Sinus tachycardia at 10 4 bpm.  QTc interval 460 ms.  PR interval 170 ms.  September 2016 ECG (independently read by me): Normal sinus rhythm at 97 bpm.  Nondiagnostic T-wave abnormalities inferolaterally.  LABS: I have personally reviewed laboratory done by Dr. Shirline Frees at [facility] on 10/19/2014  BMP Latest Ref Rng 08/02/2014  Glucose 65 - 99 mg/dL 260(H)  BUN 6 - 20 mg/dL 27(H)  Creatinine 0.61 - 1.24 mg/dL  0.80  Sodium 135 - 145 mmol/L 137  Potassium 3.5 - 5.1 mmol/L 3.3(L)  Chloride 101 - 111 mmol/L 101  CO2 22 - 32 mmol/L 19(L)  Calcium 8.9 - 10.3 mg/dL 9.3     Hepatic Function Latest Ref Rng 08/02/2014  Total Protein 6.5 - 8.1 g/dL 7.9  Albumin 3.5 - 5.0 g/dL 4.8  AST 15 - 41 U/L 23  ALT 17 - 63 U/L 30  Alk Phosphatase 38 - 126 U/L 56  Total Bilirubin 0.3 - 1.2 mg/dL 1.8(H)    CBC Latest Ref Rng 08/02/2014  WBC 4.0 - 10.5 K/uL 20.1(H)  Hemoglobin 13.0 - 17.0 g/dL 15.4  Hematocrit 39.0 - 52.0 % 42.5  Platelets 150 - 400 K/uL 259   Lab Results  Component Value Date   MCV 79.0 08/02/2014   No results found for: TSH No results found for: HGBA1C   BNP No results found for: BNP  ProBNP No results found for: PROBNP   Lipid Panel  No results found for: CHOL, TRIG, HDL, CHOLHDL, VLDL, LDLCALC, LDLDIRECT  RADIOLOGY: No results found.   ASSESSMENT AND PLAN: Mike Taylor is a 59 year old male whose cardiac risk factors include hypertension, type 2 diabetes mellitus, hyperlipidemia, and family history for CAD.  He was to have calcification involving his coronary arteries, abdominal aorta and pelvic arteries.  His ECG shows nondiagnostic T-wave changes and mild RV conduction delay.  He is asymptomatic with reference to chest pain.  When I initially saw him, he was hypertensive and had resting tachycardia.  I reviewed his echo Doppler study with him in detail, which revealed mild LVH with normal systolic function but grade 2 diastolic dysfunction.  His nuclear perfusion study reveals normal myocardial perfusion without scar or ischemia.  His lipid studies are notable for an LDL of 103 in this diabetic male with evidence for vascular calcification.  When he was initially evaluated on physical exam, his heart rate was 120 bpm today, which did improve to 10 4 bpm when his ECG was subsequently taken.  I am further titrating his Toprol-XL to 50 mg daily.  I discussed with him more  aggressive lipid therapy with target LDL less than 70 in this diabetic male with coronary calcification.  For this reason, I will further titrate his atorvastatin to 80 mg daily.  A follow-up CMETand lipid panel will be obtained in 2 months.  He will be  following up with Dr. Kenton Kingfisher in March 2017.  I will see him in 6 months for cardiology reevaluation.  Time spent: 25 minutes Mike Sine, MD, [facility] 12/21/2014 9:51 AM

## 2014-12-21 NOTE — Patient Instructions (Addendum)
Your physician has recommended you make the following change in your medication: the atorvastatin has been increased to 80 mg daily. You can take (2) of your 40 mg tablets until completed then start new 80 mg prescription that has been sent in to your pharmacy. Be sure to take 1 entire tablet of the metoprolol succ 50 mg daily.  Your physician recommends that you return for lab work in: 2 months fasting.  Your physician wants you to follow-up in: 6 months or sooner if needed. You will receive a reminder letter in the mail two months in advance. If you don't receive a letter, please call our office to schedule the follow-up appointment.  If you need a refill on your cardiac medications before your next appointment, please call your pharmacy.

## 2015-04-02 LAB — LIPID PANEL
Cholesterol: 139 mg/dL (ref 125–200)
HDL: 40 mg/dL (ref >=40)
LDL Cholesterol: 73 mg/dL (ref <130)
TRIGLYCERIDES: 132 mg/dL (ref <150)
Total CHOL/HDL Ratio: 3.5 Ratio (ref <=5.0)
VLDL: 26 mg/dL (ref <30)

## 2015-04-02 LAB — COMPREHENSIVE METABOLIC PANEL
ALBUMIN: 4.2 g/dL (ref 3.6–5.1)
ALK PHOS: 60 U/L (ref 40–115)
ALT: 21 U/L (ref 9–46)
AST: 14 U/L (ref 10–35)
BILIRUBIN TOTAL: 0.6 mg/dL (ref 0.2–1.2)
BUN: 20 mg/dL (ref 7–25)
CO2: 27 mmol/L (ref 20–31)
CREATININE: 0.84 mg/dL (ref 0.70–1.33)
Calcium: 9.6 mg/dL (ref 8.6–10.3)
Chloride: 101 mmol/L (ref 98–110)
Glucose, Bld: 210 mg/dL — ABNORMAL HIGH (ref 65–99)
Potassium: 4 mmol/L (ref 3.5–5.3)
SODIUM: 137 mmol/L (ref 135–146)
TOTAL PROTEIN: 7 g/dL (ref 6.1–8.1)

## 2015-04-22 ENCOUNTER — Other Ambulatory Visit: Payer: Self-pay

## 2015-04-22 MED ORDER — METOPROLOL SUCCINATE ER 50 MG PO TB24: 50.0000 mg | ORAL_TABLET | Freq: Every day | ORAL | Status: DC

## 2015-04-22 NOTE — Telephone Encounter (Signed)
Rx(s) sent to pharmacy electronically.  

## 2015-04-24 ENCOUNTER — Other Ambulatory Visit: Payer: Self-pay | Admitting: *Deleted

## 2015-04-24 MED ORDER — METOPROLOL SUCCINATE ER 50 MG PO TB24: 50.0000 mg | ORAL_TABLET | Freq: Every day | ORAL | Status: DC

## 2015-06-06 ENCOUNTER — Other Ambulatory Visit: Payer: Self-pay | Admitting: *Deleted

## 2015-06-06 MED ORDER — METOPROLOL SUCCINATE ER 50 MG PO TB24: 50.0000 mg | ORAL_TABLET | Freq: Every day | ORAL | Status: DC

## 2015-07-10 ENCOUNTER — Ambulatory Visit (INDEPENDENT_AMBULATORY_CARE_PROVIDER_SITE_OTHER): Payer: BLUE CROSS/BLUE SHIELD | Admitting: Cardiovascular Disease

## 2015-07-10 ENCOUNTER — Encounter: Payer: Self-pay | Admitting: Cardiovascular Disease

## 2015-07-10 VITALS — BP 140/72 | HR 97 | Ht 72.0 in | Wt 224.8 lb

## 2015-07-10 DIAGNOSIS — F1590 Other stimulant use, unspecified, uncomplicated: Secondary | ICD-10-CM

## 2015-07-10 DIAGNOSIS — E119 Type 2 diabetes mellitus without complications: Secondary | ICD-10-CM

## 2015-07-10 DIAGNOSIS — Z789 Other specified health status: Secondary | ICD-10-CM

## 2015-07-10 DIAGNOSIS — I10 Essential (primary) hypertension: Secondary | ICD-10-CM

## 2015-07-10 DIAGNOSIS — I2510 Atherosclerotic heart disease of native coronary artery without angina pectoris: Secondary | ICD-10-CM

## 2015-07-10 DIAGNOSIS — E785 Hyperlipidemia, unspecified: Secondary | ICD-10-CM

## 2015-07-10 MED ORDER — METOPROLOL SUCCINATE ER 100 MG PO TB24: 100.0000 mg | ORAL_TABLET | Freq: Every day | ORAL | Status: DC

## 2015-07-10 NOTE — Patient Instructions (Signed)
Your physician wants you to follow-up in: 6 months or sooner if needed. You will receive a reminder letter in the mail two months in advance. If you don't receive a letter, please call our office to schedule the follow-up appointment.  Your physician has recommended you make the following change in your medication:   1.) the metoprolol has been increased to 100 mg daily.  If you need a refill on your cardiac medications before your next appointment, please call your pharmacy.

## 2015-07-12 ENCOUNTER — Encounter: Payer: Self-pay | Admitting: Cardiovascular Disease

## 2015-07-12 DIAGNOSIS — Z789 Other specified health status: Secondary | ICD-10-CM | POA: Insufficient documentation

## 2015-07-12 NOTE — Progress Notes (Signed)
Patient ID: [name], male   DOB: [birth year], 60 y.o.   MRN: [id number]     Primary MD: Mike Taylor  PATIENT PROFILE: Mike Taylor is a 60 y.o. male who was initially referred through the courtesy of Mike Taylor for cardiology evaluation following detection of vascular calcification.  He presents for 6 month follow-up cardiology evaluation.   HPI:  Mike Taylor has a history of diabetes mellitus,  hypertension, as well as a history of hyperlipidemia.  He has been on losartan HCT 100/25 mg, Toprol-XL 50 mg , and prazosin 5 mg for hypertension.  He has been on farxiga, glimepiride and  metformin for diabetes mellitus.  He has been on Lipitor 80 mg for hyperlipidemia.  He also has a peripheral neuropathy for which he takes gabapentin.  Mike Taylor has long work days.  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  He typically only sleeps 4-5 hours a day week.  In June 2016 he was evaluated in the emergency room for gastroenteritis and a CT of his abdomen was obtained which did not show any acute abnormality.  However, he was noted to have calcifications throughout his coronary arteries, aorta and pelvic arteries.  There was no detection of any aneurysm.  Because of these findings and a light of his risk factors , he was referred to me and I saw him in September 2016 for initial evaluation.  At that time, his blood pressure was increased and he had a resting tachycardia of approximately 100 bpm..  I felt he also was sleep deprived with his minimal sleep duration.  I started him on Toprol-XL, initially at 25 mg.  He underwent an echo Doppler study which confirmed normal systolic function but there was evidence for mild LVH with grade 2 diastolic dysfunction.  There was mild right atrial dilatation.  He underwent a nuclear perfusion study which was normal.   Post-rest ejection fraction was 61%.  Mike Taylor denies any chest pain.  He does admit to some anxiety and has experienced episodes of occasional palpitations.  He typically drinks at least 5-6 cups of 12 ounces of coffee per day.  Laboratory by Mike Taylor in September 2016 revealed a cholesterol of 168, triglycerides 108, LDL cholesterol 103, and HDL cholesterol 43.  He presents for follow-up cardiology evaluation.  In October 2016 he underwent a nuclear stress test.  This was low risk and showed normal perfusion.  Ejection fraction was 61%.  An echo Doppler study at that time showed an EF of 60-65% with grade 2 diastolic dysfunction and normal wall motion.  He had mild right atrial dilatation.  He recently had laboratory in February 2017.  His glucose was 210.  Normal renal function and LFTs.  Lipid studies were improved on increased Lipitor and now his total cholesterol was 139, triglycerides 132, HDL 40, and LDL 73.  He presents for follow-up evaluation.  Past Medical History  Diagnosis Date  . Hypercholesterolemia   . Diabetes mellitus without complication ([place])   . Hypertension     No past surgical history on file.  No Known Allergies  Current Outpatient Prescriptions  Medication Sig Dispense Refill  . aspirin EC 81 MG tablet Take 81 mg by mouth daily with breakfast.    . atorvastatin (LIPITOR) 80 MG tablet  Take 1 tablet (80 mg total) by mouth daily. 90 tablet 3  . FARXIGA 10 MG TABS tablet Take 1 tablet by mouth daily.    Marland Kitchen gabapentin (NEURONTIN) 300 MG capsule Take 300 mg by mouth 3 (three) times daily.    Marland Kitchen glimepiride (AMARYL) 4 MG tablet Take 4 mg by mouth 2 (two) times daily.    . lansoprazole (PREVACID) 30 MG capsule Take 30 mg by mouth daily with breakfast.    . losartan-hydrochlorothiazide (HYZAAR) 100-25 MG per tablet Take 1 tablet by mouth daily with breakfast.    . metFORMIN (GLUCOPHAGE) 1000 MG tablet Take 1,000 mg by mouth 2 (two) times daily with a meal.    .  terazosin (HYTRIN) 5 MG capsule Take 5 mg by mouth daily with breakfast.    . metoprolol succinate (TOPROL-XL) 100 MG 24 hr tablet Take 1 tablet (100 mg total) by mouth daily. Take with or immediately following a meal. 90 tablet 3   No current facility-administered medications for this visit.   Facility-Administered Medications Ordered in Other Visits  Medication Dose Route Frequency Provider Last Rate Last Dose  . regadenoson (LEXISCAN) injection SOLN 0.4 mg  0.4 mg Intravenous Once Fay Records, MD        Social History   Social History  . Marital Status: Single    Spouse Name: N/A  . Number of Children: N/A  . Years of Education: N/A   Occupational History  . Not on file.   Social History Main Topics  . Smoking status: Never Smoker   . Smokeless tobacco: Not on file  . Alcohol Use: No  . Drug Use: No  . Sexual Activity: Not on file   Other Topics Concern  . Not on file   Social History Narrative   Socially he is married for 30 years.  He has 2 children.  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  Remotely he had smoked but quit smoking in 1990.  He does not drink alcohol.  He does not have the time to do any exercise.  Family History  Problem Relation Age of Onset  . Stroke Mother   . Diabetes Mother   . Cancer - Lung Father   . Heart attack Maternal Grandmother   . Leukemia Maternal Grandfather   . Stroke Paternal Grandmother    Additional family history is that his mother died at age 12 and suffered a heart attack at age 50 and a stroke at age 3.  Father had heart disease and died of lung cancer.   ROS General: Negative; No fevers, chills, or night sweats HEENT: Negative; No changes in vision or hearing, sinus congestion, difficulty swallowing Pulmonary: Negative; No cough, wheezing, shortness of breath, hemoptysis Cardiovascular:  See HPI; GI: Negative; No nausea, vomiting, diarrhea, or abdominal pain GU:  Negative; No dysuria, hematuria, or difficulty voiding Musculoskeletal: Negative; no myalgias, joint pain, or weakness Hematologic/Oncologic: Negative; no easy bruising, bleeding Endocrine: Negative; no heat/cold intolerance; no diabetes Neuro: Negative; no changes in balance, headaches Skin: Negative; No rashes or skin lesions Psychiatric: Negative; No behavioral problems, depression Sleep: Negative; No daytime sleepiness, hypersomnolence, bruxism, restless legs, hypnogagnic hallucinations Other comprehensive 14 point system review is negative   Physical Exam BP 140/72 mmHg  Pulse 97  Ht 6' (1.829 m)  Wt 224 lb 12.8 oz (101.969 kg)  BMI 30.48 kg/m2   Repeat blood pressure by me was 160/70.  Wt Readings from Last 3 Encounters:  07/10/15 224 lb 12.8 oz (101.969 kg)  12/21/14 234 lb 4.8 oz (106.278 kg)  11/23/14 226 lb (102.513 kg)   General: Alert, oriented, no distress.  Skin: normal turgor, no rashes, warm and dry HEENT: Normocephalic, atraumatic. Pupils equal round and reactive to light; sclera anicteric; extraocular muscles intact; Fundi without hemorrhages or exudates. Nose without nasal septal hypertrophy Mouth/Parynx benign; Mallinpatti scale 3 Neck: No JVD, no carotid bruits; normal carotid upstroke Lungs: clear to ausculatation and percussion; no wheezing or rales Chest wall: without tenderness to palpitation Heart: PMI not displaced, RR with occasional short burst of ectopy, s1 s2 normal, 2/6 systolic murmur, trace diastolic murmur, no rubs, gallops, thrills, or heaves Abdomen: soft, nontender; no hepatosplenomehaly, BS+; abdominal aorta nontender and not dilated by palpation. Back: no CVA tenderness Pulses 2+ Musculoskeletal: full range of motion, normal strength, no joint deformities Extremities: no clubbing cyanosis or edema, Homan's sign negative  Neurologic: grossly nonfocal; Cranial nerves grossly wnl Psychologic: Normal mood and affect  ECG (independently  read by me): Sinus rhythm at 97 bpm with an isolated PVC.  Nonspecific T-wave abnormality.  QTc interval 487 ms.  November 2016 ECG (independently read by me): Sinus tachycardia at 10 4 bpm.  QTc interval 460 ms.  PR interval 170 ms.  September 2016 ECG (independently read by me): Normal sinus rhythm at 97 bpm.  Nondiagnostic T-wave abnormalities inferolaterally.  LABS: I have personally reviewed laboratory done by Mike Taylor at [facility] on 10/19/2014.  BMP Latest Ref Rng 04/01/2015 08/02/2014  Glucose 65 - 99 mg/dL 210(H) 260(H)  BUN 7 - 25 mg/dL 20 27(H)  Creatinine 0.70 - 1.33 mg/dL 0.84 0.80  Sodium 135 - 146 mmol/L 137 137  Potassium 3.5 - 5.3 mmol/L 4.0 3.3(L)  Chloride 98 - 110 mmol/L 101 101  CO2 20 - 31 mmol/L 27 19(L)  Calcium 8.6 - 10.3 mg/dL 9.6 9.3     Hepatic Function Latest Ref Rng 04/01/2015 08/02/2014  Total Protein 6.1 - 8.1 g/dL 7.0 7.9  Albumin 3.6 - 5.1 g/dL 4.2 4.8  AST 10 - 35 U/L 14 23  ALT 9 - 46 U/L 21 30  Alk Phosphatase 40 - 115 U/L 60 56  Total Bilirubin 0.2 - 1.2 mg/dL 0.6 1.8(H)    CBC Latest Ref Rng 08/02/2014  WBC 4.0 - 10.5 K/uL 20.1(H)  Hemoglobin 13.0 - 17.0 g/dL 15.4  Hematocrit 39.0 - 52.0 % 42.5  Platelets 150 - 400 K/uL 259   Lab Results  Component Value Date   MCV 79.0 08/02/2014   No results found for: TSH No results found for: HGBA1C   BNP No results found for: BNP  ProBNP No results found for: PROBNP   Lipid Panel     Component Value Date/Time   CHOL 139 04/01/2015 0802   TRIG 132 04/01/2015 0802   HDL 40 04/01/2015 0802   CHOLHDL 3.5 04/01/2015 0802   VLDL 26 04/01/2015 0802   LDLCALC 73 04/01/2015 0802    RADIOLOGY: No results found.   ASSESSMENT AND PLAN: Mike Taylor is a 60 year old male whose cardiac risk factors include hypertension, type 2 diabetes mellitus, hyperlipidemia, and family history for CAD.  He was found to have calcification involving his coronary arteries, abdominal aorta and  pelvic arteries.  His ECG shows nondiagnostic T-wave changes and mild RV conduction delay.  He is asymptomatic with reference to chest pain.  When I initially saw him, he was hypertensive and  had resting tachycardia.  His echo Doppler study  revealed mild LVH with normal systolic function but grade 2 diastolic dysfunction.  His nuclear perfusion study revealed normal myocardial perfusion without scar or ischemia.  Recently, he continues to have inadequate sleep duration.  He keeps himself awake by drinking 5-6 cups of a 12 ounce coffee per day.  He has noticed occasional palpitations and on exam today his resting pulse is 97 and there was occasional ectopy and one PVC noted on ECG.  For this reason, I'm further titrating Toprol to 100 mg daily.  I recommended that he significantly reduce the amount of caffeine that he drinks.  We also talked about the importance of improved sleep duration and ideally as an adult.  He should speak sleeping 7-8 hours per night.  I reviewed his recent laboratory.  His lipid studies are significantly improved on the increased atorvastatin dose, which is now 80 mg and his LDL cholesterol is 73, improved from 103.  I will see him in 6 months for cardiology follow-up evaluation.  Time spent: 25 minutes  Troy Sine, MD, [facility] 07/12/2015 4:53 PM

## 2015-12-18 IMAGING — NM NM MISC PROCEDURE
3 series · 18 of 18 positions shown · non-contrast
Comparison: none

[Series 1: rest_(id)_sa · 6.4mm · 6.40mm/px · 6 of 64 frames shown]
[frame 6/64]
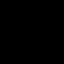
[frame 16/64]
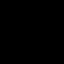
[frame 27/64]
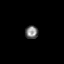
[frame 38/64]
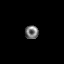
[frame 48/64]
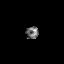
[frame 59/64]
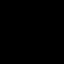

[Series 1: stress-gsp_(id)_sa · 6.4mm · 6.40mm/px · 6 of 512 frames shown]
[frame 43/512]
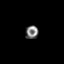
[frame 128/512]
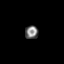
[frame 214/512]
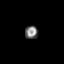
[frame 299/512]
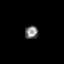
[frame 384/512]
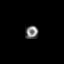
[frame 470/512]
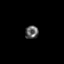

[Series 1: stress-sum-em_(id)_sa · 6.4mm · 6.40mm/px · 6 of 64 frames shown]
[frame 6/64]
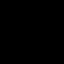
[frame 16/64]
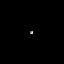
[frame 27/64]
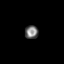
[frame 38/64]
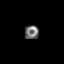
[frame 48/64]
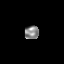
[frame 59/64]
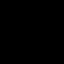

[18 of 18 positions shown; findings below may reference images not displayed]

Canned report from images found in remote index.

Refer to host system for actual result text.

## 2016-03-22 ENCOUNTER — Other Ambulatory Visit: Payer: Self-pay | Admitting: Cardiovascular Disease

## 2016-12-02 DIAGNOSIS — I10 Essential (primary) hypertension: Secondary | ICD-10-CM | POA: Diagnosis not present

## 2016-12-02 DIAGNOSIS — Z23 Encounter for immunization: Secondary | ICD-10-CM | POA: Diagnosis not present

## 2016-12-02 DIAGNOSIS — E1165 Type 2 diabetes mellitus with hyperglycemia: Secondary | ICD-10-CM | POA: Diagnosis not present

## 2016-12-02 DIAGNOSIS — E1142 Type 2 diabetes mellitus with diabetic polyneuropathy: Secondary | ICD-10-CM | POA: Diagnosis not present

## 2016-12-02 DIAGNOSIS — E7800 Pure hypercholesterolemia, unspecified: Secondary | ICD-10-CM | POA: Diagnosis not present

## 2017-04-12 DIAGNOSIS — Z8679 Personal history of other diseases of the circulatory system: Secondary | ICD-10-CM | POA: Diagnosis not present

## 2017-04-12 DIAGNOSIS — H35033 Hypertensive retinopathy, bilateral: Secondary | ICD-10-CM | POA: Diagnosis not present

## 2017-04-12 DIAGNOSIS — H25013 Cortical age-related cataract, bilateral: Secondary | ICD-10-CM | POA: Diagnosis not present

## 2017-04-12 DIAGNOSIS — E119 Type 2 diabetes mellitus without complications: Secondary | ICD-10-CM | POA: Diagnosis not present

## 2017-04-12 DIAGNOSIS — H2513 Age-related nuclear cataract, bilateral: Secondary | ICD-10-CM | POA: Diagnosis not present

## 2017-06-22 DIAGNOSIS — E7800 Pure hypercholesterolemia, unspecified: Secondary | ICD-10-CM | POA: Diagnosis not present

## 2017-06-22 DIAGNOSIS — N401 Enlarged prostate with lower urinary tract symptoms: Secondary | ICD-10-CM | POA: Diagnosis not present

## 2017-06-22 DIAGNOSIS — I10 Essential (primary) hypertension: Secondary | ICD-10-CM | POA: Diagnosis not present

## 2017-06-22 DIAGNOSIS — Z125 Encounter for screening for malignant neoplasm of prostate: Secondary | ICD-10-CM | POA: Diagnosis not present

## 2017-06-22 DIAGNOSIS — E1142 Type 2 diabetes mellitus with diabetic polyneuropathy: Secondary | ICD-10-CM | POA: Diagnosis not present

## 2017-07-16 DIAGNOSIS — D0462 Carcinoma in situ of skin of left upper limb, including shoulder: Secondary | ICD-10-CM | POA: Diagnosis not present

## 2017-07-16 DIAGNOSIS — X32XXXA Exposure to sunlight, initial encounter: Secondary | ICD-10-CM | POA: Diagnosis not present

## 2017-07-16 DIAGNOSIS — D040 Carcinoma in situ of skin of lip: Secondary | ICD-10-CM | POA: Diagnosis not present

## 2017-07-16 DIAGNOSIS — B0089 Other herpesviral infection: Secondary | ICD-10-CM | POA: Diagnosis not present

## 2017-07-16 DIAGNOSIS — L570 Actinic keratosis: Secondary | ICD-10-CM | POA: Diagnosis not present

## 2017-09-24 DIAGNOSIS — E1142 Type 2 diabetes mellitus with diabetic polyneuropathy: Secondary | ICD-10-CM | POA: Diagnosis not present

## 2017-09-24 DIAGNOSIS — E7800 Pure hypercholesterolemia, unspecified: Secondary | ICD-10-CM | POA: Diagnosis not present

## 2017-09-24 DIAGNOSIS — Z713 Dietary counseling and surveillance: Secondary | ICD-10-CM | POA: Diagnosis not present

## 2017-09-24 DIAGNOSIS — I10 Essential (primary) hypertension: Secondary | ICD-10-CM | POA: Diagnosis not present

## 2018-01-28 DIAGNOSIS — E7800 Pure hypercholesterolemia, unspecified: Secondary | ICD-10-CM | POA: Diagnosis not present

## 2018-01-28 DIAGNOSIS — Z23 Encounter for immunization: Secondary | ICD-10-CM | POA: Diagnosis not present

## 2018-01-28 DIAGNOSIS — I10 Essential (primary) hypertension: Secondary | ICD-10-CM | POA: Diagnosis not present

## 2018-01-28 DIAGNOSIS — E1142 Type 2 diabetes mellitus with diabetic polyneuropathy: Secondary | ICD-10-CM | POA: Diagnosis not present

## 2018-01-28 DIAGNOSIS — N401 Enlarged prostate with lower urinary tract symptoms: Secondary | ICD-10-CM | POA: Diagnosis not present

## 2018-03-03 DIAGNOSIS — E118 Type 2 diabetes mellitus with unspecified complications: Secondary | ICD-10-CM | POA: Diagnosis not present

## 2018-03-03 DIAGNOSIS — E1142 Type 2 diabetes mellitus with diabetic polyneuropathy: Secondary | ICD-10-CM | POA: Diagnosis not present

## 2018-03-03 DIAGNOSIS — E785 Hyperlipidemia, unspecified: Secondary | ICD-10-CM | POA: Diagnosis not present

## 2018-03-03 DIAGNOSIS — E1165 Type 2 diabetes mellitus with hyperglycemia: Secondary | ICD-10-CM | POA: Diagnosis not present

## 2018-03-17 DIAGNOSIS — E785 Hyperlipidemia, unspecified: Secondary | ICD-10-CM | POA: Diagnosis not present

## 2018-03-17 DIAGNOSIS — E1165 Type 2 diabetes mellitus with hyperglycemia: Secondary | ICD-10-CM | POA: Diagnosis not present

## 2018-03-17 DIAGNOSIS — I10 Essential (primary) hypertension: Secondary | ICD-10-CM | POA: Diagnosis not present

## 2018-03-22 DIAGNOSIS — E1142 Type 2 diabetes mellitus with diabetic polyneuropathy: Secondary | ICD-10-CM | POA: Diagnosis not present

## 2018-03-22 DIAGNOSIS — E785 Hyperlipidemia, unspecified: Secondary | ICD-10-CM | POA: Diagnosis not present

## 2018-03-22 DIAGNOSIS — E118 Type 2 diabetes mellitus with unspecified complications: Secondary | ICD-10-CM | POA: Diagnosis not present

## 2018-03-22 DIAGNOSIS — E1165 Type 2 diabetes mellitus with hyperglycemia: Secondary | ICD-10-CM | POA: Diagnosis not present

## 2018-04-14 DIAGNOSIS — E119 Type 2 diabetes mellitus without complications: Secondary | ICD-10-CM | POA: Diagnosis not present

## 2018-04-14 DIAGNOSIS — H35033 Hypertensive retinopathy, bilateral: Secondary | ICD-10-CM | POA: Diagnosis not present

## 2018-04-14 DIAGNOSIS — H2513 Age-related nuclear cataract, bilateral: Secondary | ICD-10-CM | POA: Diagnosis not present

## 2018-04-14 DIAGNOSIS — H25013 Cortical age-related cataract, bilateral: Secondary | ICD-10-CM | POA: Diagnosis not present

## 2018-04-29 DIAGNOSIS — E1142 Type 2 diabetes mellitus with diabetic polyneuropathy: Secondary | ICD-10-CM | POA: Diagnosis not present

## 2018-04-29 DIAGNOSIS — N401 Enlarged prostate with lower urinary tract symptoms: Secondary | ICD-10-CM | POA: Diagnosis not present

## 2018-04-29 DIAGNOSIS — I10 Essential (primary) hypertension: Secondary | ICD-10-CM | POA: Diagnosis not present

## 2018-04-29 DIAGNOSIS — D72829 Elevated white blood cell count, unspecified: Secondary | ICD-10-CM | POA: Diagnosis not present

## 2018-04-29 DIAGNOSIS — E7800 Pure hypercholesterolemia, unspecified: Secondary | ICD-10-CM | POA: Diagnosis not present

## 2018-07-06 DIAGNOSIS — E785 Hyperlipidemia, unspecified: Secondary | ICD-10-CM | POA: Diagnosis not present

## 2018-07-06 DIAGNOSIS — E1142 Type 2 diabetes mellitus with diabetic polyneuropathy: Secondary | ICD-10-CM | POA: Diagnosis not present

## 2018-07-06 DIAGNOSIS — E1165 Type 2 diabetes mellitus with hyperglycemia: Secondary | ICD-10-CM | POA: Diagnosis not present

## 2018-07-06 DIAGNOSIS — E118 Type 2 diabetes mellitus with unspecified complications: Secondary | ICD-10-CM | POA: Diagnosis not present

## 2018-09-02 DIAGNOSIS — E1142 Type 2 diabetes mellitus with diabetic polyneuropathy: Secondary | ICD-10-CM | POA: Diagnosis not present

## 2018-09-02 DIAGNOSIS — I10 Essential (primary) hypertension: Secondary | ICD-10-CM | POA: Diagnosis not present

## 2018-09-02 DIAGNOSIS — G629 Polyneuropathy, unspecified: Secondary | ICD-10-CM | POA: Diagnosis not present

## 2018-09-02 DIAGNOSIS — E7800 Pure hypercholesterolemia, unspecified: Secondary | ICD-10-CM | POA: Diagnosis not present

## 2018-11-07 DIAGNOSIS — E1165 Type 2 diabetes mellitus with hyperglycemia: Secondary | ICD-10-CM | POA: Diagnosis not present

## 2018-11-07 DIAGNOSIS — E785 Hyperlipidemia, unspecified: Secondary | ICD-10-CM | POA: Diagnosis not present

## 2018-11-07 DIAGNOSIS — E118 Type 2 diabetes mellitus with unspecified complications: Secondary | ICD-10-CM | POA: Diagnosis not present

## 2018-11-07 DIAGNOSIS — E1142 Type 2 diabetes mellitus with diabetic polyneuropathy: Secondary | ICD-10-CM | POA: Diagnosis not present

## 2018-11-07 DIAGNOSIS — Z23 Encounter for immunization: Secondary | ICD-10-CM | POA: Diagnosis not present

## 2018-11-11 DIAGNOSIS — E118 Type 2 diabetes mellitus with unspecified complications: Secondary | ICD-10-CM | POA: Diagnosis not present

## 2019-03-13 DIAGNOSIS — R809 Proteinuria, unspecified: Secondary | ICD-10-CM | POA: Diagnosis not present

## 2019-03-13 DIAGNOSIS — E1165 Type 2 diabetes mellitus with hyperglycemia: Secondary | ICD-10-CM | POA: Diagnosis not present

## 2019-03-13 DIAGNOSIS — E1142 Type 2 diabetes mellitus with diabetic polyneuropathy: Secondary | ICD-10-CM | POA: Diagnosis not present

## 2019-03-13 DIAGNOSIS — E785 Hyperlipidemia, unspecified: Secondary | ICD-10-CM | POA: Diagnosis not present

## 2019-03-14 DIAGNOSIS — E118 Type 2 diabetes mellitus with unspecified complications: Secondary | ICD-10-CM | POA: Diagnosis not present

## 2019-04-17 DIAGNOSIS — E113293 Type 2 diabetes mellitus with mild nonproliferative diabetic retinopathy without macular edema, bilateral: Secondary | ICD-10-CM | POA: Diagnosis not present

## 2019-04-17 DIAGNOSIS — H25013 Cortical age-related cataract, bilateral: Secondary | ICD-10-CM | POA: Diagnosis not present

## 2019-04-17 DIAGNOSIS — H2513 Age-related nuclear cataract, bilateral: Secondary | ICD-10-CM | POA: Diagnosis not present

## 2019-05-03 DIAGNOSIS — G629 Polyneuropathy, unspecified: Secondary | ICD-10-CM | POA: Diagnosis not present

## 2019-05-03 DIAGNOSIS — Z1159 Encounter for screening for other viral diseases: Secondary | ICD-10-CM | POA: Diagnosis not present

## 2019-05-03 DIAGNOSIS — Z125 Encounter for screening for malignant neoplasm of prostate: Secondary | ICD-10-CM | POA: Diagnosis not present

## 2019-05-03 DIAGNOSIS — D72829 Elevated white blood cell count, unspecified: Secondary | ICD-10-CM | POA: Diagnosis not present

## 2019-05-03 DIAGNOSIS — E7800 Pure hypercholesterolemia, unspecified: Secondary | ICD-10-CM | POA: Diagnosis not present

## 2019-05-03 DIAGNOSIS — E1142 Type 2 diabetes mellitus with diabetic polyneuropathy: Secondary | ICD-10-CM | POA: Diagnosis not present

## 2019-05-03 DIAGNOSIS — I10 Essential (primary) hypertension: Secondary | ICD-10-CM | POA: Diagnosis not present

## 2019-05-03 DIAGNOSIS — Z79899 Other long term (current) drug therapy: Secondary | ICD-10-CM | POA: Diagnosis not present

## 2019-06-12 DIAGNOSIS — M1711 Unilateral primary osteoarthritis, right knee: Secondary | ICD-10-CM | POA: Diagnosis not present

## 2019-06-12 DIAGNOSIS — M25561 Pain in right knee: Secondary | ICD-10-CM | POA: Diagnosis not present

## 2019-06-16 DIAGNOSIS — M1711 Unilateral primary osteoarthritis, right knee: Secondary | ICD-10-CM | POA: Diagnosis not present

## 2019-07-12 DIAGNOSIS — E1142 Type 2 diabetes mellitus with diabetic polyneuropathy: Secondary | ICD-10-CM | POA: Diagnosis not present

## 2019-07-12 DIAGNOSIS — R809 Proteinuria, unspecified: Secondary | ICD-10-CM | POA: Diagnosis not present

## 2019-07-12 DIAGNOSIS — E1165 Type 2 diabetes mellitus with hyperglycemia: Secondary | ICD-10-CM | POA: Diagnosis not present

## 2019-07-12 DIAGNOSIS — E785 Hyperlipidemia, unspecified: Secondary | ICD-10-CM | POA: Diagnosis not present

## 2019-08-07 ENCOUNTER — Other Ambulatory Visit: Payer: Self-pay

## 2019-08-07 ENCOUNTER — Encounter: Payer: Self-pay | Admitting: Cardiovascular Disease

## 2019-08-07 ENCOUNTER — Ambulatory Visit: Payer: BC Managed Care – PPO | Admitting: Cardiovascular Disease

## 2019-08-07 VITALS — BP 118/72 | HR 69 | Ht 72.0 in | Wt 214.8 lb

## 2019-08-07 DIAGNOSIS — E119 Type 2 diabetes mellitus without complications: Secondary | ICD-10-CM | POA: Diagnosis not present

## 2019-08-07 DIAGNOSIS — E785 Hyperlipidemia, unspecified: Secondary | ICD-10-CM

## 2019-08-07 DIAGNOSIS — I2510 Atherosclerotic heart disease of native coronary artery without angina pectoris: Secondary | ICD-10-CM

## 2019-08-07 DIAGNOSIS — I10 Essential (primary) hypertension: Secondary | ICD-10-CM | POA: Diagnosis not present

## 2019-08-07 DIAGNOSIS — G629 Polyneuropathy, unspecified: Secondary | ICD-10-CM

## 2019-08-07 NOTE — Patient Instructions (Signed)

## 2019-08-07 NOTE — Progress Notes (Signed)
Patient ID: Mike Taylor, male   DOB: 09/11/1955, 64 y.o.   MRN: [id number]     Primary MD: Dr. Shirline Frees  PATIENT PROFILE: Mike Taylor is a 64 y.o. male who was initially referred through the courtesy of Dr. Kenton Kingfisher for cardiology evaluation following detection of vascular calcification.   I last saw him in May 2017.  He is now referred by Sadie Haber and saw Marilynne Drivers, PA for cardiology reevaluation.   HPI:  [name] has a history of diabetes mellitus,  hypertension, as well as a history of hyperlipidemia.  He has been on losartan HCT 100/25 mg, Toprol-XL 50 mg , and prazosin 5 mg for hypertension.  He has been on farxiga, glimepiride and  metformin for diabetes mellitus.  He has been on Lipitor 80 mg for hyperlipidemia.  He also has a peripheral neuropathy for which he takes gabapentin.  Mr. [name] has long work days.  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  He typically only sleeps 4-5 hours a day week.  In June 2016 he was evaluated in the emergency room for gastroenteritis and a CT of his abdomen did not show any acute abnormality.  However, he was noted to have calcifications throughout his coronary arteries, aorta and pelvic arteries.  There was no detection of any aneurysm.  Because of these findings and a light of his risk factors , he was referred to me and I saw him in September 2016 for initial evaluation.  At that time, his blood pressure was increased and he had a resting tachycardia of approximately 100 bpm..  I felt he also was sleep deprived with his minimal sleep duration.  I started him on Toprol-XL, initially at 25 mg.  He underwent an echo Doppler study which confirmed normal systolic function but there was evidence for mild LVH with grade 2 diastolic dysfunction.  There was mild right atrial dilatation.  He underwent a nuclear  perfusion study which was normal.  Post-rest ejection fraction was 61%.  When I saw him, he denied any chest pain.  He admitted to some anxiety and has experienced episodes of occasional palpitations.  He typically drinks at least 5-6 cups of 12 ounces of coffee per day.  Laboratory by Dr. Kenton Kingfisher in September 2016 revealed a cholesterol of 168, triglycerides 108, LDL cholesterol 103, and HDL cholesterol 43.  He presents for follow-up cardiology evaluation.  In October 2016 he underwent a nuclear stress test.  This was low risk and showed normal perfusion.  Ejection fraction was 61%.  An echo Doppler study at that time showed an EF of 60-65% with grade 2 diastolic dysfunction and normal wall motion.  He had mild right atrial dilatation.  I last saw him in May 2017 and laboratory in February 2017 revealed an elevated glucose  210 and normal renal function and LFTs.  Lipid studies were improved on increased Lipitor and now his total cholesterol was 139, triglycerides 132, HDL 40, and LDL 73.  During that evaluation, he admitted to occasional palpitations, and his resting pulse was 97.  I recommended further titration of Toprol-XL to 100 mg daily and significant reduction in his increased caffeine use.  I have not him in over 4 years.  He recently was evaluated by Marilynne Drivers, PA at [facility] there  is now referred for cardiology reevaluation.  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  He denies any episodes of chest pain.  He denies any significant shortness of breath, dizziness, or orthostatic symptoms.  He has diabetes mellitus and is no longer on Metformin but apparently takes [place] in addition to Actos and Trulicity.  He has peripheral neuropathy and is on both gabapentin as well as Lyrica.  He continues to be on atorvastatin for hyperlipidemia.  He is on losartan HCT 50/12.5 in addition to Terazosin 5 mg daily for hypertension.  He continues to be on a baby  aspirin.  He presents for reestablishment of cardiology care.  Past Medical History:  Diagnosis Date   Diabetes mellitus without complication ([place])    Hypercholesterolemia    Hypertension     History reviewed. No pertinent surgical history.  No Known Allergies  Current Outpatient Medications  Medication Sig Dispense Refill   aspirin EC 81 MG tablet Take 81 mg by mouth daily with breakfast.     atorvastatin (LIPITOR) 80 MG tablet Take 1 tablet (80 mg total) by mouth daily. 90 tablet 3   Dulaglutide (TRULICITY) 3 BT/5.9RC SOPN Inject into the skin once a week.     FARXIGA 10 MG TABS tablet Take 1 tablet by mouth daily.     gabapentin (NEURONTIN) 300 MG capsule Take 300 mg by mouth 3 (three) times daily.     lansoprazole (PREVACID) 30 MG capsule Take 30 mg by mouth daily with breakfast.     losartan-hydrochlorothiazide (HYZAAR) 50-12.5 MG tablet Take 1 tablet by mouth daily.     metoprolol succinate (TOPROL-XL) 100 MG 24 hr tablet TAKE 1 TABLET DAILY TAKE   WITH OR IMMEDIATELY        FOLLOWING A MEAL 90 tablet 3   pioglitazone (ACTOS) 30 MG tablet Take 30 mg by mouth daily.     pregabalin (LYRICA) 100 MG capsule Take 100 mg by mouth daily.     terazosin (HYTRIN) 5 MG capsule Take 5 mg by mouth daily with breakfast.     No current facility-administered medications for this visit.   Facility-Administered Medications Ordered in Other Visits  Medication Dose Route Frequency Provider Last Rate Last Admin   regadenoson (LEXISCAN) injection SOLN 0.4 mg  0.4 mg Intravenous Once Fay Records, MD        Social History   Socioeconomic History   Marital status: Married    Spouse name: Not on file   Number of children: Not on file   Years of education: Not on file   Highest education level: Not on file  Occupational History   Not on file  Tobacco Use   Smoking status: Never Smoker   Smokeless tobacco: Never Used  Substance and Sexual Activity   Alcohol use: No     Drug use: No   Sexual activity: Not on file  Other Topics Concern   Not on file  Social History Narrative   Not on file   Social Determinants of Health   Financial Resource Strain:    Difficulty of Paying Living Expenses:   Food Insecurity:    Worried About [place] in the Last Year:    Ran Out of Food in the Last Year:   Transportation Needs:    Lack of Transportation (Medical):    Lack of Transportation (Non-Medical):   Physical Activity:    Days of Exercise per Week:  Minutes of Exercise per Session:   Stress:    Feeling of Stress :   Social Connections:    Frequency of Communication with Friends and Family:    Frequency of Social Gatherings with Friends and Family:    Attends Religious Services:    Active Member of Clubs or Organizations:    Attends Music therapist:    Marital Status:   Intimate Partner Violence:    Fear of Current or Ex-Partner:    Emotionally Abused:    Physically Abused:    Sexually Abused:    Socially he is married for >30 years.  He has 2 children.    He completed 12th grade of education.  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  Remotely he had smoked but quit smoking in 1990.  He does not drink alcohol.  He does not have the time to do any exercise.  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   Family History  Problem Relation Age of Onset   Stroke Mother    Diabetes Mother    Cancer - Lung Father    Heart attack Maternal Grandmother    Leukemia Maternal Grandfather    Stroke Paternal Grandmother    Additional family history is that his mother died at age 21 and suffered a heart attack at age 63 and a stroke at age 21.  Father had heart disease and died of lung cancer.   ROS General: Negative; No fevers, chills, or night sweats HEENT: Negative; No changes in vision or hearing, sinus congestion, difficulty swallowing Pulmonary: Negative; No cough, wheezing, shortness  of breath, hemoptysis Cardiovascular:  See HPI; GI: Negative; No nausea, vomiting, diarrhea, or abdominal pain GU: Negative; No dysuria, hematuria, or difficulty voiding Musculoskeletal: Positive for right knee discomfort, received a recent cortisone injection Hematologic/Oncologic: Negative; no easy bruising, bleeding Endocrine: Positive for diabetes mellitus Neuro: Positive for peripheral neuropathy Skin: Negative; No rashes or skin lesions Psychiatric: Negative; No behavioral problems, depression Sleep: He believes he is sleeping well.  He goes to bed between 7 and 9 PM and wakes up between 4 and 5 AM. ; No daytime sleepiness, hypersomnolence, bruxism, restless legs, hypnogagnic hallucinations Other comprehensive 14 point system review is negative   Physical Exam BP 118/72    Pulse 69    Ht 6' (1.829 m)    Wt 214 lb 12.8 oz (97.4 kg)    BMI 29.13 kg/m    Repeat blood pressure by me was 160/70.  Wt Readings from Last 3 Encounters:  08/07/19 214 lb 12.8 oz (97.4 kg)  07/10/15 224 lb 12.8 oz (102 kg)  12/21/14 234 lb 4.8 oz (106.3 kg)    General: Alert, oriented, no distress.  Skin: normal turgor, no rashes, warm and dry HEENT: Normocephalic, atraumatic. Pupils equal round and reactive to light; sclera anicteric; extraocular muscles intact;  Nose without nasal septal hypertrophy Mouth/Parynx benign; Mallinpatti scale 3 Neck: No JVD, no carotid bruits; normal carotid upstroke Lungs: clear to ausculatation and percussion; no wheezing or rales Chest wall: without tenderness to palpitation Heart: PMI not displaced, RRR, s1 s2 normal, 1/6 systolic murmur, no diastolic murmur, no rubs, gallops, thrills, or heaves Abdomen: soft, nontender; no hepatosplenomehaly, BS+; abdominal aorta nontender and not dilated by palpation. Back: no CVA tenderness Pulses 2+ Musculoskeletal: full range of motion, normal strength, no joint deformities Extremities: no clubbing cyanosis or edema, Homan's  sign negative  Neurologic: grossly nonfocal; Cranial nerves grossly wnl Psychologic:  Normal mood and affect   ECG (independently read by me): Normal sinus rhythm at 69 bpm, first-degree AV block with a PR interval at 328 ms.  Isolated PVC with retrograde P wave.  QTc interval 428 ms.  Jul 10, 2015 ECG (independently read by me): Sinus rhythm at 97 bpm with an isolated PVC.  Nonspecific T-wave abnormality.  QTc interval 487 ms.  November 2016 ECG (independently read by me): Sinus tachycardia at 10 4 bpm.  QTc interval 460 ms.  PR interval 170 ms.  September 2016 ECG (independently read by me): Normal sinus rhythm at 97 bpm.  Nondiagnostic T-wave abnormalities inferolaterally.  LABS: I have personally reviewed laboratory done by Dr. Shirline Frees at [facility] on 10/19/2014.  BMP Latest Ref Rng & Units 04/01/2015 08/02/2014  Glucose 65 - 99 mg/dL 210(H) 260(H)  BUN 7 - 25 mg/dL 20 27(H)  Creatinine 0.70 - 1.33 mg/dL 0.84 0.80  Sodium 135 - 146 mmol/L 137 137  Potassium 3.5 - 5.3 mmol/L 4.0 3.3(L)  Chloride 98 - 110 mmol/L 101 101  CO2 20 - 31 mmol/L 27 19(L)  Calcium 8.6 - 10.3 mg/dL 9.6 9.3     Hepatic Function Latest Ref Rng & Units 04/01/2015 08/02/2014  Total Protein 6.1 - 8.1 g/dL 7.0 7.9  Albumin 3.6 - 5.1 g/dL 4.2 4.8  AST 10 - 35 U/L 14 23  ALT 9 - 46 U/L 21 30  Alk Phosphatase 40 - 115 U/L 60 56  Total Bilirubin 0.2 - 1.2 mg/dL 0.6 1.8(H)    CBC Latest Ref Rng & Units 08/02/2014  WBC 4.0 - 10.5 K/uL 20.1(H)  Hemoglobin 13.0 - 17.0 g/dL 15.4  Hematocrit 39 - 52 % 42.5  Platelets 150 - 400 K/uL 259   Lab Results  Component Value Date   MCV 79.0 08/02/2014   No results found for: TSH No results found for: HGBA1C   BNP No results found for: BNP  ProBNP No results found for: PROBNP   Lipid Panel     Component Value Date/Time   CHOL 139 04/01/2015 0802   TRIG 132 04/01/2015 0802   HDL 40 04/01/2015 0802   CHOLHDL 3.5 04/01/2015 0802   VLDL 26 04/01/2015 0802    LDLCALC 73 04/01/2015 0802    RADIOLOGY: No results found.   IMPRESSION: 1. Coronary artery calcification seen on CAT scan   2. Essential hypertension   3. Hyperlipidemia with target LDL less than 70   4. Type 2 diabetes mellitus without complication, without long-term current use of insulin ([place])   5. Neuropathy     ASSESSMENT AND PLAN: Mr. Jaree Trinka is a 44 -year-old male who has a history of hypertension, type 2 diabetes mellitus, hyperlipidemia, peripheral neuropathy, and has been documented to have calcification involving his coronary arteries, abdominal aorta and pelvic arteries by CT imaging in 2016.  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  At the time he was only sleeping 4 to 5 hours a day.  Previously, he had significant caffeine use which contributed to an increased heart rate as well as episodes of palpitations.  A 2D echo Doppler study had shown normal systolic function with mild LVH and grade 2 diastolic dysfunction.  A nuclear study revealed  normal myocardial perfusion without scar or ischemia.  His work is significantly less presently and he states he now works approximately 30 hours/week.  He is sleeping better.  He also has significantly reduced his prior excess caffeine use.  He is currently on atorvastatin 80 mg and attempt to induce plaque stability and potential regression.  Most recent laboratory in March 2021 showed an LDL of 59.  His blood pressure today is well controlled on his regimen consisting of metoprolol succinate 100 mg and losartan HCT 50/12.5 mg.  He is diabetic on Farxiga, Trulicity, and Actos.  He tells me at his recent endocrinology evaluation hemoglobin A1c was 6.5 2 months ago.  Presently he is without symptoms of angina.  He remains active around the  house and also goes fishing.  He is on gabapentin and Lyrica for neuropathy.  He denies any exertional dyspnea presyncope or syncope or recent episodes of significant palpitations.  I have recommended he continue his current medical therapy and discussed optimal blood pressure control and continuation of optimal lipid management.  As long as he is stable I will see him in 1 year for reevaluation.   Troy Sine, MD, [facility] 08/13/2019 11:53 PM

## 2019-08-13 ENCOUNTER — Encounter: Payer: Self-pay | Admitting: Cardiovascular Disease

## 2020-03-25 DIAGNOSIS — E1142 Type 2 diabetes mellitus with diabetic polyneuropathy: Secondary | ICD-10-CM | POA: Diagnosis not present

## 2020-03-25 DIAGNOSIS — R809 Proteinuria, unspecified: Secondary | ICD-10-CM | POA: Diagnosis not present

## 2020-03-25 DIAGNOSIS — E103293 Type 1 diabetes mellitus with mild nonproliferative diabetic retinopathy without macular edema, bilateral: Secondary | ICD-10-CM | POA: Diagnosis not present

## 2020-03-25 DIAGNOSIS — E1165 Type 2 diabetes mellitus with hyperglycemia: Secondary | ICD-10-CM | POA: Diagnosis not present

## 2020-04-01 DIAGNOSIS — E113293 Type 2 diabetes mellitus with mild nonproliferative diabetic retinopathy without macular edema, bilateral: Secondary | ICD-10-CM | POA: Diagnosis not present

## 2020-07-24 DIAGNOSIS — E7800 Pure hypercholesterolemia, unspecified: Secondary | ICD-10-CM | POA: Diagnosis not present

## 2020-07-24 DIAGNOSIS — E1142 Type 2 diabetes mellitus with diabetic polyneuropathy: Secondary | ICD-10-CM | POA: Diagnosis not present

## 2020-07-24 DIAGNOSIS — E669 Obesity, unspecified: Secondary | ICD-10-CM | POA: Diagnosis not present

## 2020-07-24 DIAGNOSIS — I10 Essential (primary) hypertension: Secondary | ICD-10-CM | POA: Diagnosis not present

## 2020-08-26 DIAGNOSIS — L570 Actinic keratosis: Secondary | ICD-10-CM | POA: Diagnosis not present

## 2020-08-26 DIAGNOSIS — X32XXXD Exposure to sunlight, subsequent encounter: Secondary | ICD-10-CM | POA: Diagnosis not present

## 2020-09-09 ENCOUNTER — Ambulatory Visit: Payer: BC Managed Care – PPO | Admitting: Cardiovascular Disease

## 2020-09-23 ENCOUNTER — Encounter: Payer: Self-pay | Admitting: Cardiovascular Disease

## 2020-09-23 ENCOUNTER — Other Ambulatory Visit: Payer: Self-pay

## 2020-09-23 ENCOUNTER — Ambulatory Visit: Payer: BC Managed Care – PPO | Admitting: Cardiovascular Disease

## 2020-09-23 VITALS — BP 122/62 | HR 76 | Ht 72.0 in | Wt 225.2 lb

## 2020-09-23 DIAGNOSIS — G629 Polyneuropathy, unspecified: Secondary | ICD-10-CM

## 2020-09-23 DIAGNOSIS — I2510 Atherosclerotic heart disease of native coronary artery without angina pectoris: Secondary | ICD-10-CM | POA: Diagnosis not present

## 2020-09-23 DIAGNOSIS — E785 Hyperlipidemia, unspecified: Secondary | ICD-10-CM

## 2020-09-23 DIAGNOSIS — R0602 Shortness of breath: Secondary | ICD-10-CM

## 2020-09-23 DIAGNOSIS — I10 Essential (primary) hypertension: Secondary | ICD-10-CM

## 2020-09-23 DIAGNOSIS — E119 Type 2 diabetes mellitus without complications: Secondary | ICD-10-CM

## 2020-09-23 NOTE — Progress Notes (Signed)
Patient ID: Mike Taylor, male   DOB: 10-05-1955, 65 y.o.   MRN: [id number]     Primary MD: Mike Taylor  PATIENT PROFILE: Mike Taylor is a 65 y.o. male who was initially referred through the courtesy of Mike Taylor for cardiology evaluation following detection of vascular calcification.  He was  re- referred by Mike Drivers, PA at [facility] for cardiology reevaluation  He presents for a one year follow-up.   HPI:  Mike Taylor has a history of diabetes mellitus,  hypertension, as well as a history of hyperlipidemia.  He has been on losartan HCT 100/25 mg, Toprol-XL 50 mg , and prazosin 5 mg for hypertension.  He has been on farxiga, glimepiride and  metformin for diabetes mellitus.  He has been on Lipitor 80 mg for hyperlipidemia.  He also has a peripheral neuropathy for which he takes gabapentin.  When I initially saw Mike Taylor he had long work days.  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  He was only sleeping sleep 4-5 hours a day.   In June 2016 he was evaluated in the emergency room for gastroenteritis and a CT of his abdomen did not show any acute abnormality.  However, he was noted to have calcifications throughout his coronary arteries, aorta and pelvic arteries.  There was no detection of any aneurysm.  Because of these findings and a light of his risk factors , he was referred to me and I saw him in September 2016 for initial evaluation.  At that time, his blood pressure was increased and he had a resting tachycardia of approximately 100 bpm..  I felt he also was sleep deprived with his minimal sleep duration.  I started him on Toprol-XL, initially at 25 mg.  He underwent an echo Doppler study which confirmed normal systolic function but there was evidence for mild LVH with grade 2 diastolic dysfunction.  There was mild right  atrial dilatation.  He underwent a nuclear perfusion study which was normal.  Post-rest ejection fraction was 61%.  When I saw him, he denied any chest pain.  He admitted to some anxiety and has experienced episodes of occasional palpitations.  He typically drinks at least 5-6 cups of 12 ounces of coffee per day.  Laboratory by Mike Taylor in September 2016 revealed a cholesterol of 168, triglycerides 108, LDL cholesterol 103, and HDL cholesterol 43.  He presents for follow-up cardiology evaluation.  In October 2016 he underwent a nuclear stress test.  This was low risk and showed normal perfusion.  Ejection fraction was 61%.  An echo Doppler study at that time showed an EF of 60-65% with grade 2 diastolic dysfunction and normal wall motion.  He had mild right atrial dilatation.  I saw him in May 2017 and laboratory in February 2017 revealed an elevated glucose  210 and normal renal function and LFTs.  Lipid studies were improved on increased Lipitor and now his total cholesterol was 139, triglycerides 132, HDL 40, and LDL 73.  During that evaluation, he admitted to occasional palpitations, and his resting pulse was 97.  I recommended further titration of Toprol-XL to 100 mg daily and significant reduction in his increased caffeine use.  I last saw him in June 2021 and had not seen him in  4 years.  He recently was evaluated by Mike Drivers, PA at [facility] who referred for cardiology reevaluation.   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  He denied any episodes of chest pain, significant shortness of breath, dizziness, or orthostatic symptoms.  He has diabetes mellitus and is no longer on Metformin but apparently takes [place] in addition to Actos and Trulicity.  He has peripheral neuropathy and was on both gabapentin as well as Lyrica.  He continued to be on atorvastatin for hyperlipidemia.  He was on losartan HCT 50/12.5 in addition to  Terazosin 5 mg daily for hypertension and continued to be on a baby aspirin.  During that evaluation, his ECG showed sinus rhythm with first-degree AV block with a PR 328 ms.  An isolated PVC with retrograde P waves.  He was doing well on his current medical regimen.  Since I last saw him, he is now retired.  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  He has noted some mild shortness of breath particularly during periods of significant heat and humidity.  He recently has seen Mike Taylor and had anterior actinic keratoses frozen from his face.  He denies any chest pain PND orthopnea.  He tells me laboratory in January revealed a hemoglobin A1c of 7.8.  Presently he is on terazosin 5 mg, losartan HCT 50/12.5 mg for hypertension in addition to metoprolol succinate 100 mg daily.  He is on gabapentin and pregabalin for neuropathy.  He is diabetic on Trulicity, metformin, pioglitazone in addition to [place].  He has been on atorvastatin 80 mg daily for hyperlipidemia.  He presents for yearly evaluation.   Past Medical History:  Diagnosis Date   Diabetes mellitus without complication ([place])    Hypercholesterolemia    Hypertension     No past surgical history on file.  No Known Allergies  Current Outpatient Medications  Medication Sig Dispense Refill   aspirin EC 81 MG tablet Take 81 mg by mouth daily with breakfast.     atorvastatin (LIPITOR) 80 MG tablet Take 1 tablet (80 mg total) by mouth daily. 90 tablet 3   Dulaglutide (TRULICITY) 3 YT/0.1SW SOPN Inject into the skin once a week.     FARXIGA 10 MG TABS tablet Take 1 tablet by mouth daily.     gabapentin (NEURONTIN) 300 MG capsule Take 300 mg by mouth 3 (three) times daily.     lansoprazole (PREVACID) 30 MG capsule Take 30 mg by mouth daily with breakfast.     losartan-hydrochlorothiazide (HYZAAR) 50-12.5 MG tablet Take 1 tablet by mouth daily.     metFORMIN (GLUCOPHAGE) 500 MG tablet Take 500 mg by mouth  2 (two) times daily with a meal.     metoprolol succinate (TOPROL-XL) 100 MG 24 hr tablet TAKE 1 TABLET DAILY TAKE   WITH OR IMMEDIATELY        FOLLOWING A MEAL 90 tablet 3   pioglitazone (ACTOS) 30 MG tablet Take 30 mg by mouth daily.     pregabalin (LYRICA) 100 MG capsule Take 100 mg by mouth daily.     terazosin (HYTRIN) 5 MG capsule Take 5 mg by mouth daily with breakfast.     No current facility-administered medications for this visit.   Facility-Administered Medications Ordered in Other Visits  Medication Dose Route Frequency Provider Last Rate Last Admin   regadenoson (LEXISCAN) injection SOLN 0.4 mg  0.4 mg Intravenous Once Fay Records, MD        Social History   Socioeconomic History   Marital status: Married    Spouse name: Not on file   Number of children: Not on file   Years of education: Not on file   Highest education level: Not on file  Occupational History   Not on file  Tobacco Use   Smoking status: Never   Smokeless tobacco: Never  Substance and Sexual Activity   Alcohol use: No   Drug use: No   Sexual activity: Not on file  Other Topics Concern   Not on file  Social History Narrative   Not on file   Social Determinants of Health   Financial Resource Strain: Not on file  Food Insecurity: Not on file  Transportation Needs: Not on file  Physical Activity: Not on file  Stress: Not on file  Social Connections: Not on file  Intimate Partner Violence: Not on file   Socially he is married for >30 years.  He has 2 children.    He completed 12th grade of education.  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  Remotely he had smoked but quit smoking in 1990.  He does not drink alcohol.  He does not have the time to do any exercise.  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   Family History  Problem Relation Age of Onset   Stroke Mother    Diabetes Mother    Cancer - Lung Father    Heart attack Maternal Grandmother    Leukemia Maternal  Grandfather    Stroke Paternal Grandmother    Additional family history is that his mother died at age 50 and suffered a heart attack at age 66 and a stroke at age 47.  Father had heart disease and died of lung cancer.   ROS General: Negative; No fevers, chills, or night sweats HEENT: Negative; No changes in vision or hearing, sinus congestion, difficulty swallowing Pulmonary: Negative; No cough, wheezing, shortness of breath, hemoptysis Cardiovascular:  See HPI; GI: Negative; No nausea, vomiting, diarrhea, or abdominal pain GU: Negative; No dysuria, hematuria, or difficulty voiding Musculoskeletal: Positive for right knee discomfort, received a recent cortisone injection Hematologic/Oncologic: Negative; no easy bruising, bleeding Endocrine: Positive for diabetes mellitus Neuro: Positive for peripheral neuropathy Skin: Negative; No rashes or skin lesions Psychiatric: Negative; No behavioral problems, depression Sleep: Now that he is retired his sleep is improved.  He denies any residual daytime sleepiness, hypersomnolence, bruxism, restless legs, hypnogagnic hallucinations Other comprehensive 14 point system review is negative   Physical Exam BP 122/62 (BP Location: Right Arm, Patient Position: Sitting, Cuff Size: Normal)   Pulse 76   Ht 6' (1.829 m)   Wt 225 lb 3.2 oz (102.2 kg)   SpO2 98%   BMI 30.54 kg/m    Repeat blood pressure by me was 128/64  Wt Readings from Last 3 Encounters:  09/23/20 225 lb 3.2 oz (102.2 kg)  08/07/19 214 lb 12.8 oz (97.4 kg)  07/10/15 224 lb 12.8 oz (102 kg)   General: Alert, oriented, no distress.  Skin: normal turgor, no rashes, warm and dry HEENT: Normocephalic, atraumatic. Pupils equal round and reactive to light; sclera anicteric; extraocular muscles intact; Nose without nasal septal hypertrophy Mouth/Parynx benign; Mallinpatti scale 3 Neck: No JVD, no carotid bruits; normal carotid upstroke Lungs: clear to ausculatation and percussion;  no wheezing or rales Chest wall: without tenderness to palpitation  Heart: PMI not displaced, RRR, s1 s2 normal, 1/6 systolic murmur, no diastolic murmur, no rubs, gallops, thrills, or heaves Abdomen: soft, nontender; no hepatosplenomehaly, BS+; abdominal aorta nontender and not dilated by palpation. Back: no CVA tenderness Pulses 2+ Musculoskeletal: full range of motion, normal strength, no joint deformities Extremities: no clubbing cyanosis or edema, Homan's sign negative  Neurologic: grossly nonfocal; Cranial nerves grossly wnl Psychologic: Normal mood and affect   ECG (independently read by me):  NSR at 76, 1st degree AV block, PR 354 msec, RBBB  August 07, 2019 ECG (independently read by me): Normal sinus rhythm at 69 bpm, first-degree AV block with a PR interval at 328 ms.  Isolated PVC with retrograde P wave.  QTc interval 428 ms.  Jul 10, 2015 ECG (independently read by me): Sinus rhythm at 97 bpm with an isolated PVC.  Nonspecific T-wave abnormality.  QTc interval 487 ms.  November 2016 ECG (independently read by me): Sinus tachycardia at 10 4 bpm.  QTc interval 460 ms.  PR interval 170 ms.  September 2016 ECG (independently read by me): Normal sinus rhythm at 97 bpm.  Nondiagnostic T-wave abnormalities inferolaterally.  LABS: I have personally reviewed laboratory done by Mike Taylor at [facility] on 10/19/2014.  BMP Latest Ref Rng & Units 04/01/2015 08/02/2014  Glucose 65 - 99 mg/dL 210(H) 260(H)  BUN 7 - 25 mg/dL 20 27(H)  Creatinine 0.70 - 1.33 mg/dL 0.84 0.80  Sodium 135 - 146 mmol/L 137 137  Potassium 3.5 - 5.3 mmol/L 4.0 3.3(L)  Chloride 98 - 110 mmol/L 101 101  CO2 20 - 31 mmol/L 27 19(L)  Calcium 8.6 - 10.3 mg/dL 9.6 9.3     Hepatic Function Latest Ref Rng & Units 04/01/2015 08/02/2014  Total Protein 6.1 - 8.1 g/dL 7.0 7.9  Albumin 3.6 - 5.1 g/dL 4.2 4.8  AST 10 - 35 U/L 14 23  ALT 9 - 46 U/L 21 30  Alk Phosphatase 40 - 115 U/L 60 56  Total Bilirubin 0.2 - 1.2  mg/dL 0.6 1.8(H)    CBC Latest Ref Rng & Units 08/02/2014  WBC 4.0 - 10.5 K/uL 20.1(H)  Hemoglobin 13.0 - 17.0 g/dL 15.4  Hematocrit 39.0 - 52.0 % 42.5  Platelets 150 - 400 K/uL 259   Lab Results  Component Value Date   MCV 79.0 08/02/2014   No results found for: TSH No results found for: HGBA1C   BNP No results found for: BNP  ProBNP No results found for: PROBNP   Lipid Panel     Component Value Date/Time   CHOL 139 04/01/2015 0802   TRIG 132 04/01/2015 0802   HDL 40 04/01/2015 0802   CHOLHDL 3.5 04/01/2015 0802   VLDL 26 04/01/2015 0802   LDLCALC 73 04/01/2015 0802    RADIOLOGY: No results found.   IMPRESSION: 1. Essential hypertension   2. Coronary artery calcification seen on CAT scan   3. Hyperlipidemia with target LDL less than 70   4. Shortness of breath   5. Type 2 diabetes mellitus without complication, without long-term current use of insulin ([place])   6. Neuropathy      ASSESSMENT AND PLAN: Mr. Cadyn Fann is a 65 year old male who has a history of hypertension, type 2 diabetes mellitus, hyperlipidemia, peripheral neuropathy, and has been documented to have calcification involving his coronary arteries, abdominal aorta and pelvic arteries by CT imaging in 2016.  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  At the time he was only sleeping 4 to 5 hours a day.  Previously, he had significant caffeine use which contributed to an increased heart rate as well as episodes of palpitations.  A 2D echo Doppler study revealed normal systolic function with mild LVH and grade 2 diastolic dysfunction.  A nuclear study revealed normal myocardial perfusion without scar or ischemia.  Now that he is retired, his blood pressure significantly improved and at present he is on a  regimen consisting of losartan HCT 50/12.5 mg, metoprolol succinate 100 mg daily in addition to terazosin 5 mg.  He is diabetic.  His hemoglobin A1c in January 2022 was elevated at 7.8.  He continues to be on metformin 500 twice daily but also is on Trulicity, pioglitazone, in addition to [place].  He is on atorvastatin 80 mg daily for hyperlipidemia.  Laboratory in January 2022 showed a total cholesterol 141, HDL 45, LDL 78, triglycerides 90.  He continues to be on both gabapentin and pregabalin for his peripheral neuropathy.  Presently he denies any exertional chest pain or dyspnea, presyncope or syncope.  He denies any palpitations.  He has first-degree AV block which is stable but PR interval is prolonged at 354 ms, slightly increased compared to 1 year previously.  He is followed by Dr. Thomasenia Sales at [facility] for endocrinologic care.  Compared to 1 year ago, weight has increased from 2 42to 225 and BMI is compatible with mild obesity.  I discussed the importance of increased exercise and weight loss.  I am recommending he undergo an echo Doppler study for re-evaluation of systolic and diastolic function since his last echo evaluation was in 2016.  I will notify him regarding the results.  As long as he is stable, I will see him in 1 year for reevaluation or sooner as needed.  Troy Sine, MD, [facility] 09/30/2020 7:14 PM

## 2020-09-23 NOTE — Patient Instructions (Addendum)
Medication Instructions:  NO CHANGES  *If you need a refill on your cardiac medications before your next appointment, please call your pharmacy*   Lab Work:  NOT NEEDED   Testing/Procedures: WILL BE SCHEDULE AT [address]   Your physician has requested that you have an echocardiogram. Echocardiography is a painless test that uses sound waves to create images of your heart. It provides your doctor with information about the size and shape of your heart and how well your heart's chambers and valves are working. This procedure takes approximately one hour. There are no restrictions for this procedure.     Follow-Up: At [facility], you and your health needs are our priority.  As part of our continuing mission to provide you with exceptional heart care, we have created designated Provider Care Teams.  These Care Teams include your primary Cardiologist (physician) and Advanced Practice Providers (APPs -  Physician Assistants and Nurse Practitioners) who all work together to provide you with the care you need, when you need it.     Your next appointment:   12 month(s)  DEPENDING ON REPORT MAY SEE YOU SOONER  The format for your next appointment:   In Person  Provider:   Nicki Guadalajara, MD   Other Instructions

## 2020-09-30 ENCOUNTER — Encounter: Payer: Self-pay | Admitting: Cardiovascular Disease

## 2020-10-14 DIAGNOSIS — R809 Proteinuria, unspecified: Secondary | ICD-10-CM | POA: Diagnosis not present

## 2020-10-14 DIAGNOSIS — E1165 Type 2 diabetes mellitus with hyperglycemia: Secondary | ICD-10-CM | POA: Diagnosis not present

## 2020-10-14 DIAGNOSIS — E103293 Type 1 diabetes mellitus with mild nonproliferative diabetic retinopathy without macular edema, bilateral: Secondary | ICD-10-CM | POA: Diagnosis not present

## 2020-10-14 DIAGNOSIS — E1142 Type 2 diabetes mellitus with diabetic polyneuropathy: Secondary | ICD-10-CM | POA: Diagnosis not present

## 2020-10-28 ENCOUNTER — Other Ambulatory Visit (HOSPITAL_COMMUNITY): Payer: BC Managed Care – PPO

## 2020-11-04 ENCOUNTER — Other Ambulatory Visit: Payer: Self-pay

## 2020-11-04 ENCOUNTER — Ambulatory Visit (HOSPITAL_COMMUNITY): Payer: BC Managed Care – PPO | Attending: Cardiology

## 2020-11-04 DIAGNOSIS — R0602 Shortness of breath: Secondary | ICD-10-CM | POA: Diagnosis not present

## 2020-11-04 DIAGNOSIS — I10 Essential (primary) hypertension: Secondary | ICD-10-CM | POA: Diagnosis not present

## 2020-11-04 DIAGNOSIS — I2510 Atherosclerotic heart disease of native coronary artery without angina pectoris: Secondary | ICD-10-CM | POA: Diagnosis not present

## 2020-11-04 LAB — ECHOCARDIOGRAM COMPLETE
Area-P 1/2: 2.76 cm2
MV VTI: 2.15 cm2
S' Lateral: 2.9 cm

## 2020-11-08 DIAGNOSIS — Z23 Encounter for immunization: Secondary | ICD-10-CM | POA: Diagnosis not present

## 2020-12-09 DIAGNOSIS — U071 COVID-19: Secondary | ICD-10-CM | POA: Diagnosis not present

## 2021-01-27 DIAGNOSIS — J3089 Other allergic rhinitis: Secondary | ICD-10-CM | POA: Diagnosis not present

## 2021-01-27 DIAGNOSIS — I10 Essential (primary) hypertension: Secondary | ICD-10-CM | POA: Diagnosis not present

## 2021-01-27 DIAGNOSIS — E1165 Type 2 diabetes mellitus with hyperglycemia: Secondary | ICD-10-CM | POA: Diagnosis not present

## 2021-01-27 DIAGNOSIS — E7800 Pure hypercholesterolemia, unspecified: Secondary | ICD-10-CM | POA: Diagnosis not present

## 2021-01-27 DIAGNOSIS — E1142 Type 2 diabetes mellitus with diabetic polyneuropathy: Secondary | ICD-10-CM | POA: Diagnosis not present

## 2021-01-27 DIAGNOSIS — N401 Enlarged prostate with lower urinary tract symptoms: Secondary | ICD-10-CM | POA: Diagnosis not present

## 2021-03-17 DIAGNOSIS — D72829 Elevated white blood cell count, unspecified: Secondary | ICD-10-CM | POA: Diagnosis not present

## 2021-03-31 DIAGNOSIS — H02834 Dermatochalasis of left upper eyelid: Secondary | ICD-10-CM | POA: Diagnosis not present

## 2021-03-31 DIAGNOSIS — H02831 Dermatochalasis of right upper eyelid: Secondary | ICD-10-CM | POA: Diagnosis not present

## 2021-03-31 DIAGNOSIS — H25813 Combined forms of age-related cataract, bilateral: Secondary | ICD-10-CM | POA: Diagnosis not present

## 2021-03-31 DIAGNOSIS — E113293 Type 2 diabetes mellitus with mild nonproliferative diabetic retinopathy without macular edema, bilateral: Secondary | ICD-10-CM | POA: Diagnosis not present

## 2021-04-21 ENCOUNTER — Other Ambulatory Visit (HOSPITAL_COMMUNITY): Payer: Self-pay

## 2021-04-21 DIAGNOSIS — E785 Hyperlipidemia, unspecified: Secondary | ICD-10-CM | POA: Diagnosis not present

## 2021-04-21 DIAGNOSIS — I10 Essential (primary) hypertension: Secondary | ICD-10-CM | POA: Diagnosis not present

## 2021-04-21 DIAGNOSIS — E1165 Type 2 diabetes mellitus with hyperglycemia: Secondary | ICD-10-CM | POA: Diagnosis not present

## 2021-04-21 MED ORDER — TRULICITY 4.5 MG/0.5ML ~~LOC~~ SOAJ
SUBCUTANEOUS | 2 refills | Status: AC
  Filled 2021-04-21: qty 2, 28d supply, fill #0

## 2021-06-16 DIAGNOSIS — I7090 Unspecified atherosclerosis: Secondary | ICD-10-CM | POA: Diagnosis not present

## 2021-06-16 DIAGNOSIS — I10 Essential (primary) hypertension: Secondary | ICD-10-CM | POA: Diagnosis not present

## 2021-06-16 DIAGNOSIS — D72829 Elevated white blood cell count, unspecified: Secondary | ICD-10-CM | POA: Diagnosis not present

## 2021-06-16 DIAGNOSIS — I2510 Atherosclerotic heart disease of native coronary artery without angina pectoris: Secondary | ICD-10-CM | POA: Diagnosis not present

## 2021-06-16 DIAGNOSIS — E559 Vitamin D deficiency, unspecified: Secondary | ICD-10-CM | POA: Diagnosis not present

## 2021-06-16 DIAGNOSIS — E669 Obesity, unspecified: Secondary | ICD-10-CM | POA: Diagnosis not present

## 2021-06-16 DIAGNOSIS — E1142 Type 2 diabetes mellitus with diabetic polyneuropathy: Secondary | ICD-10-CM | POA: Diagnosis not present

## 2021-06-18 ENCOUNTER — Telehealth: Payer: Self-pay | Admitting: Hematology and Oncology

## 2021-06-18 NOTE — Telephone Encounter (Signed)
Scheduled appt per 5/3 referral. Pt is aware of appt date and time. Pt is aware to arrive 15 mins prior to appt time and to bring and updated insurance card. Pt is aware of appt location.   ?

## 2021-07-19 DIAGNOSIS — U071 COVID-19: Secondary | ICD-10-CM | POA: Diagnosis not present

## 2021-07-21 ENCOUNTER — Inpatient Hospital Stay: Payer: Medicare Other

## 2021-07-21 ENCOUNTER — Inpatient Hospital Stay: Payer: Medicare Other | Admitting: Hematology and Oncology

## 2021-07-29 ENCOUNTER — Telehealth: Payer: Self-pay | Admitting: Internal Medicine

## 2021-07-29 NOTE — Telephone Encounter (Signed)
R/s pt's appt time for new hem appt due to provider being out of office. Spoke to pt, he is aware of new appt date and time.

## 2021-08-15 ENCOUNTER — Other Ambulatory Visit: Payer: Self-pay | Admitting: Internal Medicine

## 2021-08-15 DIAGNOSIS — D72829 Elevated white blood cell count, unspecified: Secondary | ICD-10-CM

## 2021-08-18 ENCOUNTER — Other Ambulatory Visit: Payer: Medicare Other

## 2021-08-18 ENCOUNTER — Other Ambulatory Visit: Payer: Self-pay

## 2021-08-18 ENCOUNTER — Inpatient Hospital Stay: Payer: Medicare Other | Attending: Hematology and Oncology | Admitting: Internal Medicine

## 2021-08-18 ENCOUNTER — Encounter: Payer: Medicare Other | Admitting: Hematology and Oncology

## 2021-08-18 ENCOUNTER — Ambulatory Visit (HOSPITAL_COMMUNITY)
Admission: RE | Admit: 2021-08-18 | Discharge: 2021-08-18 | Disposition: A | Discharge status: Home / Self Care | Payer: Medicare Other | Source: Ambulatory Visit | Attending: Internal Medicine | Admitting: Internal Medicine

## 2021-08-18 ENCOUNTER — Inpatient Hospital Stay: Payer: Medicare Other

## 2021-08-18 VITALS — BP 104/58 | HR 52 | Temp 97.8°F | Resp 18 | Ht 72.5 in | Wt 219.8 lb

## 2021-08-18 DIAGNOSIS — E119 Type 2 diabetes mellitus without complications: Secondary | ICD-10-CM | POA: Insufficient documentation

## 2021-08-18 DIAGNOSIS — Z806 Family history of leukemia: Secondary | ICD-10-CM | POA: Diagnosis not present

## 2021-08-18 DIAGNOSIS — R059 Cough, unspecified: Secondary | ICD-10-CM | POA: Insufficient documentation

## 2021-08-18 DIAGNOSIS — Z7982 Long term (current) use of aspirin: Secondary | ICD-10-CM | POA: Insufficient documentation

## 2021-08-18 DIAGNOSIS — R053 Chronic cough: Secondary | ICD-10-CM | POA: Diagnosis not present

## 2021-08-18 DIAGNOSIS — Z7984 Long term (current) use of oral hypoglycemic drugs: Secondary | ICD-10-CM | POA: Insufficient documentation

## 2021-08-18 DIAGNOSIS — D72829 Elevated white blood cell count, unspecified: Secondary | ICD-10-CM | POA: Diagnosis not present

## 2021-08-18 DIAGNOSIS — I10 Essential (primary) hypertension: Secondary | ICD-10-CM | POA: Diagnosis not present

## 2021-08-18 DIAGNOSIS — Z7985 Long-term (current) use of injectable non-insulin antidiabetic drugs: Secondary | ICD-10-CM | POA: Diagnosis not present

## 2021-08-18 DIAGNOSIS — Z79899 Other long term (current) drug therapy: Secondary | ICD-10-CM | POA: Insufficient documentation

## 2021-08-18 DIAGNOSIS — Z801 Family history of malignant neoplasm of trachea, bronchus and lung: Secondary | ICD-10-CM | POA: Insufficient documentation

## 2021-08-18 DIAGNOSIS — D72820 Lymphocytosis (symptomatic): Secondary | ICD-10-CM | POA: Insufficient documentation

## 2021-08-18 LAB — CBC WITH DIFFERENTIAL (CANCER CENTER ONLY)
Abs Immature Granulocytes: 0.04 10*3/uL (ref 0.00–0.07)
Basophils Absolute: 0.1 10*3/uL (ref 0.0–0.1)
Basophils Relative: 1 %
Eosinophils Absolute: 0.2 10*3/uL (ref 0.0–0.5)
Eosinophils Relative: 2 %
HCT: 39.9 % (ref 39.0–52.0)
Hemoglobin: 13.7 g/dL (ref 13.0–17.0)
Immature Granulocytes: 0 %
Lymphocytes Relative: 42 %
Lymphs Abs: 5.4 10*3/uL — ABNORMAL HIGH (ref 0.7–4.0)
MCH: 28.8 pg (ref 26.0–34.0)
MCHC: 34.3 g/dL (ref 30.0–36.0)
MCV: 83.8 fL (ref 80.0–100.0)
Monocytes Absolute: 0.7 10*3/uL (ref 0.1–1.0)
Monocytes Relative: 6 %
Neutro Abs: 6.4 10*3/uL (ref 1.7–7.7)
Neutrophils Relative %: 49 %
Platelet Count: 285 10*3/uL (ref 150–400)
RBC: 4.76 MIL/uL (ref 4.22–5.81)
RDW: 13.7 % (ref 11.5–15.5)
Smear Review: NORMAL
WBC Count: 12.9 10*3/uL — ABNORMAL HIGH (ref 4.0–10.5)
nRBC: 0 % (ref 0.0–0.2)

## 2021-08-18 LAB — CMP (CANCER CENTER ONLY)
ALT: 25 U/L (ref 0–44)
AST: 20 U/L (ref 15–41)
Albumin: 4.5 g/dL (ref 3.5–5.0)
Alkaline Phosphatase: 52 U/L (ref 38–126)
Anion gap: 7 (ref 5–15)
BUN: 13 mg/dL (ref 8–23)
CO2: 30 mmol/L (ref 22–32)
Calcium: 10 mg/dL (ref 8.9–10.3)
Chloride: 101 mmol/L (ref 98–111)
Creatinine: 0.75 mg/dL (ref 0.61–1.24)
GFR, Estimated: >60 mL/min (ref >60)
Glucose, Bld: 133 mg/dL — ABNORMAL HIGH (ref 70–99)
Potassium: 4.3 mmol/L (ref 3.5–5.1)
Sodium: 138 mmol/L (ref 135–145)
Total Bilirubin: 0.8 mg/dL (ref 0.3–1.2)
Total Protein: 7.2 g/dL (ref 6.5–8.1)

## 2021-08-18 LAB — LACTATE DEHYDROGENASE: LDH: 156 U/L (ref 98–192)

## 2021-08-18 NOTE — Progress Notes (Signed)
[facility] [phone]   Fax:(336) [phone]  CONSULT NOTE  REFERRING PHYSICIAN: Dr. [name]  REASON FOR CONSULTATION:  66 years old white male with persistent lymphocytosis.  HPI Mike Taylor is a 66 y.o. male with past medical history significant for diabetes mellitus, hypertension and dyslipidemia.  The patient was seen by his primary care physician for routine evaluation and management of his hypertension and diabetes. He had CBC performed on March 17, 2021 that showed elevated white blood count of 13.8 with absolute lymphocyte count of 5000.  He has normal hemoglobin of 14.1 and hematocrit 41.5 with normal platelets count of 323,000.  The patient had repeat CBC on Jun 16, 2021 and it showed persistent elevation of the total white blood count of 15.3 with absolute lymphocyte count of 6000.  His hemoglobin was normal at 13.6 and hematocrit 39.9% and platelet count of 308,000. Because of the persistent lymphocytosis the patient was referred to me today for evaluation and recommendation regarding his condition.  His only complaint recently has been chronic postnasal drainage with cough as well as shortness of breath with exertion.  He also had some tingling in the left thumb and index finger. He denied having any chest pain or hemoptysis.  He has occasional diarrhea and few episodes of nausea and vomiting secondary to his treatment with metformin.  He has no abdominal pain or constipation.  He denied having any headache or visual changes.  He has no bleeding, bruises or ecchymosis.  He denied having any palpable lymphadenopathy. Family history significant for mother with intestinal ischemia as well as diabetes.  Father had lung cancer at age 48 and maternal grandfather had leukemia. The patient is married and has a son and daughter.  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  He was accompanied today by his wife Mike Taylor.   The patient has a history of smoking more than 1 pack/day for around 20 years and quit 34 years ago.  He has no history of alcohol or drug abuse.  HPI  Past Medical History:  Diagnosis Date   Diabetes mellitus without complication (HCC)    Hypercholesterolemia    Hypertension     No past surgical history on file.  Family History  Problem Relation Age of Onset   Stroke Mother    Diabetes Mother    Cancer - Lung Father    Heart attack Maternal Grandmother    Leukemia Maternal Grandfather    Stroke Paternal Grandmother     Social History Social History   Tobacco Use   Smoking status: Never   Smokeless tobacco: Never  Substance Use Topics   Alcohol use: No   Drug use: No    No Known Allergies  Current Outpatient Medications  Medication Sig Dispense Refill   aspirin EC 81 MG tablet Take 81 mg by mouth daily with breakfast.     atorvastatin (LIPITOR) 80 MG tablet Take 1 tablet (80 mg total) by mouth daily. 90 tablet 3   Dulaglutide (TRULICITY) 4.5 MG/0.5ML SOPN Inject 4.5 mg into the skin once a week 2 mL 2   FARXIGA 10 MG TABS tablet Take 1 tablet by mouth daily.     gabapentin (NEURONTIN) 300 MG capsule Take 300 mg by mouth 3 (three) times daily.     lansoprazole (PREVACID) 30 MG capsule Take 30 mg by mouth daily with breakfast.     losartan-hydrochlorothiazide (HYZAAR) 50-12.5 MG tablet  Take 1 tablet by mouth daily.     metFORMIN (GLUCOPHAGE) 500 MG tablet Take 500 mg by mouth 2 (two) times daily with a meal.     metoprolol succinate (TOPROL-XL) 100 MG 24 hr tablet TAKE 1 TABLET DAILY TAKE   WITH OR IMMEDIATELY        FOLLOWING A MEAL 90 tablet 3   pioglitazone (ACTOS) 30 MG tablet Take 30 mg by mouth daily.     pregabalin (LYRICA) 100 MG capsule Take 100 mg by mouth daily.     terazosin (HYTRIN) 5 MG capsule Take 5 mg by mouth daily with breakfast.     Dulaglutide (TRULICITY) 3 MG/0.5ML SOPN Inject into the skin once a week.     No current facility-administered  medications for this visit.   Facility-Administered Medications Ordered in Other Visits  Medication Dose Route Frequency Provider Last Rate Last Admin   regadenoson (LEXISCAN) injection SOLN 0.4 mg  0.4 mg Intravenous Once Pricilla Riffle, MD        Review of Systems  Constitutional: negative Eyes: negative Ears, nose, mouth, throat, and face: positive for postnasal drainage Respiratory: positive for cough and dyspnea on exertion Cardiovascular: negative Gastrointestinal: positive for diarrhea and nausea Genitourinary:negative Integument/breast: negative Hematologic/lymphatic: negative Musculoskeletal:negative Neurological: negative Behavioral/Psych: negative Endocrine: negative Allergic/Immunologic: negative  Physical Exam  [id number], healthy, no distress, well nourished, and well developed SKIN: skin color, texture, turgor are normal, no rashes or significant lesions HEAD: Normocephalic, No masses, lesions, tenderness or abnormalities EYES: normal, PERRLA, Conjunctiva are pink and non-injected EARS: External ears normal, Canals clear OROPHARYNX:no exudate, no erythema, and lips, buccal mucosa, and tongue normal  NECK: supple, no adenopathy, no JVD LYMPH:  no palpable lymphadenopathy, no hepatosplenomegaly LUNGS: clear to auscultation , and palpation HEART: regular rate & rhythm, no murmurs, and no gallops ABDOMEN:abdomen soft, non-tender, normal bowel sounds, and no masses or organomegaly BACK: Back symmetric, no curvature., No CVA tenderness EXTREMITIES:no joint deformities, effusion, or inflammation, no edema  NEURO: alert & oriented x 3 with fluent speech, no focal motor/sensory deficits  PERFORMANCE STATUS: ECOG 0  LABORATORY DATA: Lab Results  Component Value Date   WBC 12.9 (H) 08/18/2021   HGB 13.7 08/18/2021   HCT 39.9 08/18/2021   MCV 83.8 08/18/2021   PLT 285 08/18/2021      Chemistry      Component Value Date/Time   NA 138 08/18/2021 1119   K 4.3  08/18/2021 1119   CL 101 08/18/2021 1119   CO2 30 08/18/2021 1119   BUN 13 08/18/2021 1119   CREATININE 0.75 08/18/2021 1119   CREATININE 0.84 04/01/2015 0802      Component Value Date/Time   CALCIUM 10.0 08/18/2021 1119   ALKPHOS 52 08/18/2021 1119   AST 20 08/18/2021 1119   ALT 25 08/18/2021 1119   BILITOT 0.8 08/18/2021 1119       RADIOGRAPHIC STUDIES: No results found.  ASSESSMENT: This is a very pleasant 66 years old white male with persistent leukocytosis and lymphocytosis highly suspicious for stage disease chronic lymphocytic leukemia pending confirmation with the flow cytometry.   PLAN: I had a lengthy discussion with the patient and his wife today about his condition and further investigation to confirm his diagnosis. Repeat CBC today showed persistent elevation of the total white blood count to 12.9 with absolute lymphocyte count of 5400 comprehensive metabolic panel was unremarkable except for the elevated blood glucose consistent with his history of diabetes mellitus.  LDH was normal. I  also ordered flow cytometry of the peripheral blood for CLL panel. I explained to the patient that he is likely to have chronic lymphocytic leukemia stage 0 and the current standard of care in the absence of symptoms or complication is observation and close monitoring. I will see the patient back for follow-up visit in 6 months for evaluation and repeat blood work. If there is any concerning findings, I will call the patient sooner with recommendation. He was advised to call immediately if he has any other concerning symptoms in the interval. The patient voices understanding of current disease status and treatment options and is in agreement with the current care plan.  All questions were answered. The patient knows to call the clinic with any problems, questions or concerns. We can certainly see the patient much sooner if necessary.  Thank you so much for allowing me to participate in  the care of Mike Taylor. I will continue to follow up the patient with you and assist in his care.  The total time spent in the appointment was 60 minutes.  Disclaimer: This note was dictated with voice recognition software. Similar sounding words can inadvertently be transcribed and may not be corrected upon review.   Lajuana Matte August 18, 2021, 11:59 AM

## 2021-08-20 LAB — SURGICAL PATHOLOGY

## 2021-08-21 LAB — FLOW CYTOMETRY

## 2021-09-01 DIAGNOSIS — E785 Hyperlipidemia, unspecified: Secondary | ICD-10-CM | POA: Diagnosis not present

## 2021-09-01 DIAGNOSIS — H3500 Unspecified background retinopathy: Secondary | ICD-10-CM | POA: Diagnosis not present

## 2021-09-01 DIAGNOSIS — E1165 Type 2 diabetes mellitus with hyperglycemia: Secondary | ICD-10-CM | POA: Diagnosis not present

## 2021-09-01 DIAGNOSIS — I10 Essential (primary) hypertension: Secondary | ICD-10-CM | POA: Diagnosis not present

## 2021-11-17 DIAGNOSIS — H3500 Unspecified background retinopathy: Secondary | ICD-10-CM | POA: Diagnosis not present

## 2021-11-17 DIAGNOSIS — E785 Hyperlipidemia, unspecified: Secondary | ICD-10-CM | POA: Diagnosis not present

## 2021-11-17 DIAGNOSIS — I10 Essential (primary) hypertension: Secondary | ICD-10-CM | POA: Diagnosis not present

## 2021-11-17 DIAGNOSIS — E1165 Type 2 diabetes mellitus with hyperglycemia: Secondary | ICD-10-CM | POA: Diagnosis not present

## 2021-12-22 DIAGNOSIS — E1142 Type 2 diabetes mellitus with diabetic polyneuropathy: Secondary | ICD-10-CM | POA: Diagnosis not present

## 2021-12-22 DIAGNOSIS — I10 Essential (primary) hypertension: Secondary | ICD-10-CM | POA: Diagnosis not present

## 2021-12-22 DIAGNOSIS — I7090 Unspecified atherosclerosis: Secondary | ICD-10-CM | POA: Diagnosis not present

## 2021-12-22 DIAGNOSIS — D72829 Elevated white blood cell count, unspecified: Secondary | ICD-10-CM | POA: Diagnosis not present

## 2022-02-17 ENCOUNTER — Other Ambulatory Visit: Payer: Medicare Other

## 2022-02-17 ENCOUNTER — Ambulatory Visit: Payer: Medicare Other | Admitting: Internal Medicine

## 2022-02-23 ENCOUNTER — Inpatient Hospital Stay: Payer: Medicare Other | Attending: Internal Medicine

## 2022-02-23 ENCOUNTER — Inpatient Hospital Stay: Payer: Medicare Other | Admitting: Internal Medicine

## 2022-02-23 ENCOUNTER — Other Ambulatory Visit: Payer: Self-pay

## 2022-02-23 VITALS — BP 153/50 | HR 75 | Temp 98.6°F | Resp 16 | Wt 237.8 lb

## 2022-02-23 DIAGNOSIS — Z7984 Long term (current) use of oral hypoglycemic drugs: Secondary | ICD-10-CM | POA: Insufficient documentation

## 2022-02-23 DIAGNOSIS — E7800 Pure hypercholesterolemia, unspecified: Secondary | ICD-10-CM | POA: Insufficient documentation

## 2022-02-23 DIAGNOSIS — Z7985 Long-term (current) use of injectable non-insulin antidiabetic drugs: Secondary | ICD-10-CM | POA: Insufficient documentation

## 2022-02-23 DIAGNOSIS — D72820 Lymphocytosis (symptomatic): Secondary | ICD-10-CM

## 2022-02-23 DIAGNOSIS — I10 Essential (primary) hypertension: Secondary | ICD-10-CM | POA: Insufficient documentation

## 2022-02-23 DIAGNOSIS — E119 Type 2 diabetes mellitus without complications: Secondary | ICD-10-CM | POA: Insufficient documentation

## 2022-02-23 DIAGNOSIS — Z79899 Other long term (current) drug therapy: Secondary | ICD-10-CM | POA: Diagnosis not present

## 2022-02-23 DIAGNOSIS — Z7982 Long term (current) use of aspirin: Secondary | ICD-10-CM | POA: Insufficient documentation

## 2022-02-23 LAB — CMP (CANCER CENTER ONLY)
ALT: 34 U/L (ref 0–44)
AST: 22 U/L (ref 15–41)
Albumin: 4.5 g/dL (ref 3.5–5.0)
Alkaline Phosphatase: 57 U/L (ref 38–126)
Anion gap: 6 (ref 5–15)
BUN: 16 mg/dL (ref 8–23)
CO2: 30 mmol/L (ref 22–32)
Calcium: 10 mg/dL (ref 8.9–10.3)
Chloride: 101 mmol/L (ref 98–111)
Creatinine: 0.78 mg/dL (ref 0.61–1.24)
GFR, Estimated: >60 mL/min (ref >60)
Glucose, Bld: 172 mg/dL — ABNORMAL HIGH (ref 70–99)
Potassium: 4.3 mmol/L (ref 3.5–5.1)
Sodium: 137 mmol/L (ref 135–145)
Total Bilirubin: 0.7 mg/dL (ref 0.3–1.2)
Total Protein: 6.7 g/dL (ref 6.5–8.1)

## 2022-02-23 LAB — CBC WITH DIFFERENTIAL (CANCER CENTER ONLY)
Abs Immature Granulocytes: 0.06 10*3/uL (ref 0.00–0.07)
Basophils Absolute: 0.1 10*3/uL (ref 0.0–0.1)
Basophils Relative: 1 %
Eosinophils Absolute: 0.3 10*3/uL (ref 0.0–0.5)
Eosinophils Relative: 2 %
HCT: 39.8 % (ref 39.0–52.0)
Hemoglobin: 13.9 g/dL (ref 13.0–17.0)
Immature Granulocytes: 0 %
Lymphocytes Relative: 43 %
Lymphs Abs: 6.6 10*3/uL — ABNORMAL HIGH (ref 0.7–4.0)
MCH: 29.1 pg (ref 26.0–34.0)
MCHC: 34.9 g/dL (ref 30.0–36.0)
MCV: 83.4 fL (ref 80.0–100.0)
Monocytes Absolute: 0.8 10*3/uL (ref 0.1–1.0)
Monocytes Relative: 6 %
Neutro Abs: 7.5 10*3/uL (ref 1.7–7.7)
Neutrophils Relative %: 48 %
Platelet Count: 276 10*3/uL (ref 150–400)
RBC: 4.77 MIL/uL (ref 4.22–5.81)
RDW: 13.4 % (ref 11.5–15.5)
Smear Review: NORMAL
WBC Count: 15.3 10*3/uL — ABNORMAL HIGH (ref 4.0–10.5)
nRBC: 0 % (ref 0.0–0.2)

## 2022-02-23 LAB — LACTATE DEHYDROGENASE: LDH: 195 U/L — ABNORMAL HIGH (ref 98–192)

## 2022-02-23 NOTE — Progress Notes (Signed)
[facility] [phone]   Fax:(336) [phone]  OFFICE PROGRESS NOTE  [name], MD [address] [place] [address]  DIAGNOSIS: monoclonal B-cell lymphocytosis suspicious for CLL  PRIOR THERAPY: None  CURRENT THERAPY: Observation  INTERVAL HISTORY: Mike Taylor 67 y.o. male returns to the clinic today for follow-up visit accompanied by his wife.  The patient is feeling fine today with no concerning complaints.  He denied having any current weight loss or night sweats.  He has no nausea, vomiting, diarrhea or constipation.  He has no headache or visual changes.  He denied having any fever or chills.  He has no palpable lymphadenopathy.  He has no bleeding, bruises or ecchymosis.  He is here today for evaluation and repeat blood work.  MEDICAL HISTORY: Past Medical History:  Diagnosis Date   Diabetes mellitus without complication (HCC)    Hypercholesterolemia    Hypertension     ALLERGIES:  has No Known Allergies.  MEDICATIONS:  Current Outpatient Medications  Medication Sig Dispense Refill   aspirin EC 81 MG tablet Take 81 mg by mouth daily with breakfast.     atorvastatin (LIPITOR) 80 MG tablet Take 1 tablet (80 mg total) by mouth daily. 90 tablet 3   Dulaglutide (TRULICITY) 3 MG/0.5ML SOPN Inject into the skin once a week.     Dulaglutide (TRULICITY) 4.5 MG/0.5ML SOPN Inject 4.5 mg into the skin once a week 2 mL 2   FARXIGA 10 MG TABS tablet Take 1 tablet by mouth daily.     gabapentin (NEURONTIN) 300 MG capsule Take 300 mg by mouth 3 (three) times daily.     lansoprazole (PREVACID) 30 MG capsule Take 30 mg by mouth daily with breakfast.     losartan-hydrochlorothiazide (HYZAAR) 50-12.5 MG tablet Take 1 tablet by mouth daily.     metFORMIN (GLUCOPHAGE) 500 MG tablet Take 500 mg by mouth 2 (two) times daily with a meal.     metoprolol succinate (TOPROL-XL) 100 MG 24 hr tablet TAKE 1 TABLET DAILY TAKE   WITH OR  IMMEDIATELY        FOLLOWING A MEAL 90 tablet 3   pioglitazone (ACTOS) 30 MG tablet Take 30 mg by mouth daily.     pregabalin (LYRICA) 100 MG capsule Take 100 mg by mouth daily.     terazosin (HYTRIN) 5 MG capsule Take 5 mg by mouth daily with breakfast.     No current facility-administered medications for this visit.   Facility-Administered Medications Ordered in Other Visits  Medication Dose Route Frequency Provider Last Rate Last Admin   regadenoson (LEXISCAN) injection SOLN 0.4 mg  0.4 mg Intravenous Once Pricilla Riffle, MD        SURGICAL HISTORY: No past surgical history on file.  REVIEW OF SYSTEMS:  A comprehensive review of systems was negative.   PHYSICAL EXAMINATION: General appearance: alert, cooperative, and no distress Head: Normocephalic, without obvious abnormality, atraumatic Neck: no adenopathy, no JVD, supple, symmetrical, trachea midline, and thyroid not enlarged, symmetric, no tenderness/mass/nodules Lymph nodes: Cervical, supraclavicular, and axillary nodes normal. Resp: clear to auscultation bilaterally Back: symmetric, no curvature. ROM normal. No CVA tenderness. Cardio: regular rate and rhythm, S1, S2 normal, no murmur, click, rub or gallop GI: soft, non-tender; bowel sounds normal; no masses,  no organomegaly Extremities: extremities normal, atraumatic, no cyanosis or edema  ECOG PERFORMANCE STATUS: 0 - Asymptomatic  Blood pressure (!) 153/50, pulse 75, temperature 98.6 F (37  C), temperature source Oral, resp. rate 16, weight 237 lb 12.8 oz (107.9 kg), SpO2 98 %.  LABORATORY DATA: Lab Results  Component Value Date   WBC 12.9 (H) 08/18/2021   HGB 13.7 08/18/2021   HCT 39.9 08/18/2021   MCV 83.8 08/18/2021   PLT 285 08/18/2021      Chemistry      Component Value Date/Time   NA 138 08/18/2021 1119   K 4.3 08/18/2021 1119   CL 101 08/18/2021 1119   CO2 30 08/18/2021 1119   BUN 13 08/18/2021 1119   CREATININE 0.75 08/18/2021 1119   CREATININE 0.84  04/01/2015 0802      Component Value Date/Time   CALCIUM 10.0 08/18/2021 1119   ALKPHOS 52 08/18/2021 1119   AST 20 08/18/2021 1119   ALT 25 08/18/2021 1119   BILITOT 0.8 08/18/2021 1119       RADIOGRAPHIC STUDIES: No results found.  ASSESSMENT AND PLAN: This is a very pleasant 67 years old white male with likely chronic lymphocytic leukemia (CLL) diagnosed in July 2023.  The patient is currently on observation and he is feeling fine. Repeat CBC today showed elevated white blood count of 15.3.  The differential is still pending. The patient is currently asymptomatic. I recommended for him to continue on observation with repeat blood work in 6 months. He was advised to call immediately if he has any other concerning symptoms in the interval. The patient voices understanding of current disease status and treatment options and is in agreement with the current care plan.  All questions were answered. The patient knows to call the clinic with any problems, questions or concerns. We can certainly see the patient much sooner if necessary.  The total time spent in the appointment was 20 minutes.  Disclaimer: This note was dictated with voice recognition software. Similar sounding words can inadvertently be transcribed and may not be corrected upon review.

## 2022-03-09 DIAGNOSIS — H3500 Unspecified background retinopathy: Secondary | ICD-10-CM | POA: Diagnosis not present

## 2022-03-09 DIAGNOSIS — E1165 Type 2 diabetes mellitus with hyperglycemia: Secondary | ICD-10-CM | POA: Diagnosis not present

## 2022-03-09 DIAGNOSIS — I10 Essential (primary) hypertension: Secondary | ICD-10-CM | POA: Diagnosis not present

## 2022-03-09 DIAGNOSIS — E785 Hyperlipidemia, unspecified: Secondary | ICD-10-CM | POA: Diagnosis not present

## 2022-04-06 DIAGNOSIS — E113291 Type 2 diabetes mellitus with mild nonproliferative diabetic retinopathy without macular edema, right eye: Secondary | ICD-10-CM | POA: Diagnosis not present

## 2022-04-06 DIAGNOSIS — H11153 Pinguecula, bilateral: Secondary | ICD-10-CM | POA: Diagnosis not present

## 2022-04-06 DIAGNOSIS — H02831 Dermatochalasis of right upper eyelid: Secondary | ICD-10-CM | POA: Diagnosis not present

## 2022-04-06 DIAGNOSIS — H524 Presbyopia: Secondary | ICD-10-CM | POA: Diagnosis not present

## 2022-04-06 DIAGNOSIS — H25813 Combined forms of age-related cataract, bilateral: Secondary | ICD-10-CM | POA: Diagnosis not present

## 2022-06-29 DIAGNOSIS — Z125 Encounter for screening for malignant neoplasm of prostate: Secondary | ICD-10-CM | POA: Diagnosis not present

## 2022-06-29 DIAGNOSIS — T148XXA Other injury of unspecified body region, initial encounter: Secondary | ICD-10-CM | POA: Diagnosis not present

## 2022-06-29 DIAGNOSIS — Z0000 Encounter for general adult medical examination without abnormal findings: Secondary | ICD-10-CM | POA: Diagnosis not present

## 2022-06-29 DIAGNOSIS — E559 Vitamin D deficiency, unspecified: Secondary | ICD-10-CM | POA: Diagnosis not present

## 2022-06-29 DIAGNOSIS — Z23 Encounter for immunization: Secondary | ICD-10-CM | POA: Diagnosis not present

## 2022-07-06 DIAGNOSIS — H3500 Unspecified background retinopathy: Secondary | ICD-10-CM | POA: Diagnosis not present

## 2022-07-06 DIAGNOSIS — E785 Hyperlipidemia, unspecified: Secondary | ICD-10-CM | POA: Diagnosis not present

## 2022-07-06 DIAGNOSIS — E1165 Type 2 diabetes mellitus with hyperglycemia: Secondary | ICD-10-CM | POA: Diagnosis not present

## 2022-07-06 DIAGNOSIS — I10 Essential (primary) hypertension: Secondary | ICD-10-CM | POA: Diagnosis not present

## 2022-08-18 ENCOUNTER — Ambulatory Visit: Payer: Medicare Other | Admitting: Physician Assistant

## 2022-08-24 ENCOUNTER — Inpatient Hospital Stay: Payer: Medicare Other | Admitting: Internal Medicine

## 2022-08-24 ENCOUNTER — Other Ambulatory Visit: Payer: Self-pay

## 2022-08-24 ENCOUNTER — Inpatient Hospital Stay: Payer: Medicare Other | Attending: Internal Medicine

## 2022-08-24 VITALS — BP 125/50 | HR 66 | Temp 97.9°F | Resp 16 | Ht 72.5 in | Wt 232.5 lb

## 2022-08-24 DIAGNOSIS — D72820 Lymphocytosis (symptomatic): Secondary | ICD-10-CM | POA: Insufficient documentation

## 2022-08-24 DIAGNOSIS — D649 Anemia, unspecified: Secondary | ICD-10-CM | POA: Diagnosis not present

## 2022-08-24 DIAGNOSIS — Z7982 Long term (current) use of aspirin: Secondary | ICD-10-CM | POA: Insufficient documentation

## 2022-08-24 DIAGNOSIS — Z79899 Other long term (current) drug therapy: Secondary | ICD-10-CM | POA: Insufficient documentation

## 2022-08-24 LAB — CBC WITH DIFFERENTIAL (CANCER CENTER ONLY)
Abs Immature Granulocytes: 0.1 10*3/uL — ABNORMAL HIGH (ref 0.00–0.07)
Basophils Absolute: 0.1 10*3/uL (ref 0.0–0.1)
Basophils Relative: 1 %
Eosinophils Absolute: 0.3 10*3/uL (ref 0.0–0.5)
Eosinophils Relative: 2 %
HCT: 38.4 % — ABNORMAL LOW (ref 39.0–52.0)
Hemoglobin: 12.9 g/dL — ABNORMAL LOW (ref 13.0–17.0)
Immature Granulocytes: 1 %
Lymphocytes Relative: 42 %
Lymphs Abs: 6.1 10*3/uL — ABNORMAL HIGH (ref 0.7–4.0)
MCH: 28.6 pg (ref 26.0–34.0)
MCHC: 33.6 g/dL (ref 30.0–36.0)
MCV: 85.1 fL (ref 80.0–100.0)
Monocytes Absolute: 0.8 10*3/uL (ref 0.1–1.0)
Monocytes Relative: 6 %
Neutro Abs: 7.1 10*3/uL (ref 1.7–7.7)
Neutrophils Relative %: 48 %
Platelet Count: 277 10*3/uL (ref 150–400)
RBC: 4.51 MIL/uL (ref 4.22–5.81)
RDW: 13.3 % (ref 11.5–15.5)
Smear Review: NORMAL
WBC Count: 14.5 10*3/uL — ABNORMAL HIGH (ref 4.0–10.5)
nRBC: 0 % (ref 0.0–0.2)

## 2022-08-24 LAB — CMP (CANCER CENTER ONLY)
ALT: 25 U/L (ref 0–44)
AST: 17 U/L (ref 15–41)
Albumin: 3.9 g/dL (ref 3.5–5.0)
Alkaline Phosphatase: 61 U/L (ref 38–126)
Anion gap: 5 (ref 5–15)
BUN: 13 mg/dL (ref 8–23)
CO2: 29 mmol/L (ref 22–32)
Calcium: 9.2 mg/dL (ref 8.9–10.3)
Chloride: 104 mmol/L (ref 98–111)
Creatinine: 0.8 mg/dL (ref 0.61–1.24)
GFR, Estimated: >60 mL/min (ref >60)
Glucose, Bld: 182 mg/dL — ABNORMAL HIGH (ref 70–99)
Potassium: 4.2 mmol/L (ref 3.5–5.1)
Sodium: 138 mmol/L (ref 135–145)
Total Bilirubin: 0.6 mg/dL (ref 0.3–1.2)
Total Protein: 6.7 g/dL (ref 6.5–8.1)

## 2022-08-24 LAB — LACTATE DEHYDROGENASE: LDH: 154 U/L (ref 98–192)

## 2022-08-24 NOTE — Progress Notes (Signed)
[facility] [phone]   Fax:(336) [phone]  OFFICE PROGRESS NOTE  Mike Retort, MD [phone] [address] [place] [address]  DIAGNOSIS: Monoclonal B-cell lymphocytosis suspicious for CLL  PRIOR THERAPY: None  CURRENT THERAPY: Observation  INTERVAL HISTORY: Mike Taylor 67 y.o. male returns to the clinic today for follow-up visit.  The patient is feeling fine today with no concerning complaints.  He denied having any current chest pain, shortness of breath, cough or hemoptysis.  He has no weight loss or night sweats.  He has no bleeding, bruises or ecchymosis.  He has no palpable lymphadenopathy.  The patient denied having any nausea, vomiting, diarrhea or constipation.  He has no headache or visual changes.  He is here today for evaluation and repeat blood work.  MEDICAL HISTORY: Past Medical History:  Diagnosis Date   Diabetes mellitus without complication (HCC)    Hypercholesterolemia    Hypertension     ALLERGIES:  has No Known Allergies.  MEDICATIONS:  Current Outpatient Medications  Medication Sig Dispense Refill   aspirin EC 81 MG tablet Take 81 mg by mouth daily with breakfast.     atorvastatin (LIPITOR) 80 MG tablet Take 1 tablet (80 mg total) by mouth daily. 90 tablet 3   Dulaglutide (TRULICITY) 4.5 MG/0.5ML SOPN Inject 4.5 mg into the skin once a week 2 mL 2   gabapentin (NEURONTIN) 300 MG capsule Take 300 mg by mouth 3 (three) times daily.     glipiZIDE (GLUCOTROL XL) 2.5 MG 24 hr tablet Take 2.5 mg by mouth every morning.     lansoprazole (PREVACID) 30 MG capsule Take 30 mg by mouth daily with breakfast.     losartan-hydrochlorothiazide (HYZAAR) 50-12.5 MG tablet Take 1 tablet by mouth daily.     metFORMIN (GLUCOPHAGE) 500 MG tablet Take 500 mg by mouth 2 (two) times daily with a meal.     metoprolol succinate (TOPROL-XL) 100 MG 24 hr tablet TAKE 1 TABLET DAILY TAKE   WITH OR IMMEDIATELY        FOLLOWING A MEAL 90  tablet 3   pioglitazone (ACTOS) 30 MG tablet Take 30 mg by mouth daily.     pregabalin (LYRICA) 100 MG capsule Take 100 mg by mouth daily.     terazosin (HYTRIN) 5 MG capsule Take 5 mg by mouth daily with breakfast.     No current facility-administered medications for this visit.   Facility-Administered Medications Ordered in Other Visits  Medication Dose Route Frequency Provider Last Rate Last Admin   regadenoson (LEXISCAN) injection SOLN 0.4 mg  0.4 mg Intravenous Once Pricilla Riffle, MD        SURGICAL HISTORY: No past surgical history on file.  REVIEW OF SYSTEMS:  A comprehensive review of systems was negative.   PHYSICAL EXAMINATION: General appearance: alert, cooperative, and no distress Head: Normocephalic, without obvious abnormality, atraumatic Neck: no adenopathy, no JVD, supple, symmetrical, trachea midline, and thyroid not enlarged, symmetric, no tenderness/mass/nodules Lymph nodes: Cervical, supraclavicular, and axillary nodes normal. Resp: clear to auscultation bilaterally Back: symmetric, no curvature. ROM normal. No CVA tenderness. Cardio: regular rate and rhythm, S1, S2 normal, no murmur, click, rub or gallop GI: soft, non-tender; bowel sounds normal; no masses,  no organomegaly Extremities: extremities normal, atraumatic, no cyanosis or edema  ECOG PERFORMANCE STATUS: 0 - Asymptomatic  Blood pressure (!) 125/50, pulse 66, temperature 97.9 F (36.6 C), temperature source Oral, resp. rate 16, height 6'  0.5" (1.842 m), weight 232 lb 8 oz (105.5 kg), SpO2 99 %.  LABORATORY DATA: Lab Results  Component Value Date   WBC 15.3 (H) 02/23/2022   HGB 13.9 02/23/2022   HCT 39.8 02/23/2022   MCV 83.4 02/23/2022   PLT 276 02/23/2022      Chemistry      Component Value Date/Time   NA 137 02/23/2022 0800   K 4.3 02/23/2022 0800   CL 101 02/23/2022 0800   CO2 30 02/23/2022 0800   BUN 16 02/23/2022 0800   CREATININE 0.78 02/23/2022 0800   CREATININE 0.84 04/01/2015  0802      Component Value Date/Time   CALCIUM 10.0 02/23/2022 0800   ALKPHOS 57 02/23/2022 0800   AST 22 02/23/2022 0800   ALT 34 02/23/2022 0800   BILITOT 0.7 02/23/2022 0800       RADIOGRAPHIC STUDIES: No results found.  ASSESSMENT AND PLAN: This is a very pleasant 67 years old white male with likely chronic lymphocytic leukemia (CLL) diagnosed in July 2023.   The patient is currently on observation and he is feeling fine with no concerning symptoms. Repeat CBC today showed total white blood count of 14.5 but the differential is still pending.  He also has mild anemia with hemoglobin of 12.9 and hematocrit 38.4%. I recommended for the patient to continue on observation with repeat blood work in 6 months.  He was also advised to call immediately if he has any other concerning symptoms in the interval.  The patient voices understanding of current disease status and treatment options and is in agreement with the current care plan.  All questions were answered. The patient knows to call the clinic with any problems, questions or concerns. We can certainly see the patient much sooner if necessary.  The total time spent in the appointment was 20 minutes.  Disclaimer: This note was dictated with voice recognition software. Similar sounding words can inadvertently be transcribed and may not be corrected upon review.

## 2022-08-31 DIAGNOSIS — K080 Exfoliation of teeth due to systemic causes: Secondary | ICD-10-CM | POA: Diagnosis not present

## 2022-09-26 NOTE — Progress Notes (Unsigned)
Cardiology Clinic Note   Patient Name: Mike Taylor Date of Encounter: 09/28/2022  Primary Care Provider:  Noberto Retort, MD Primary Cardiologist:  Nicki Guadalajara, MD  Patient Profile    67 year old male who was last seen in the office by Dr. Tresa Endo on 09/23/2020 with history of hypertension, hyperlipidemia, diabetes mellitus.  Past Medical History    Past Medical History:  Diagnosis Date   Diabetes mellitus without complication (HCC)    Hypercholesterolemia    Hypertension    No past surgical history on file.  Allergies  No Known Allergies  History of Present Illness    [name] is a very pleasant man who comes today for ongoing assessment and management of hypertension, hyperlipidemia, with history of type 2 diabetes mellitus, and associated diabetic neuropathy.  Last seen in 2022 by Dr. Tresa Endo and was stable from a cardiac standpoint, he was to return in 1 year for annual appointment no testing or medication adjustments were made at that time.  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  He remains active in the afternoon.  He denies any dizziness, chest pain, he does have occasional palpitations, no dyspnea on exertion, near syncope, edema or fatigue.  Home Medications    Current Outpatient Medications  Medication Sig Dispense Refill   aspirin EC 81 MG tablet Take 81 mg by mouth daily with breakfast.     atorvastatin (LIPITOR) 80 MG tablet Take 1 tablet (80 mg total) by mouth daily. 90 tablet 3   Dulaglutide (TRULICITY) 4.5 MG/0.5ML SOPN Inject 4.5 mg into the skin once a week 2 mL 2   gabapentin (NEURONTIN) 300 MG capsule Take 300 mg by mouth 3 (three) times daily.     lansoprazole (PREVACID) 30 MG capsule Take 30 mg by mouth daily with breakfast.     losartan-hydrochlorothiazide (HYZAAR) 50-12.5 MG tablet Take 1 tablet by mouth daily.     metFORMIN (GLUCOPHAGE) 500 MG tablet Take 500 mg by mouth 2  (two) times daily with a meal.     metoprolol succinate (TOPROL-XL) 100 MG 24 hr tablet TAKE 1 TABLET DAILY TAKE   WITH OR IMMEDIATELY        FOLLOWING A MEAL 90 tablet 3   pioglitazone (ACTOS) 30 MG tablet Take 30 mg by mouth daily.     pregabalin (LYRICA) 100 MG capsule Take 100 mg by mouth daily.     terazosin (HYTRIN) 5 MG capsule Take 5 mg by mouth daily with breakfast.     glipiZIDE (GLUCOTROL XL) 2.5 MG 24 hr tablet Take 2.5 mg by mouth every morning.     No current facility-administered medications for this visit.   Facility-Administered Medications Ordered in Other Visits  Medication Dose Route Frequency Provider Last Rate Last Admin   regadenoson (LEXISCAN) injection SOLN 0.4 mg  0.4 mg Intravenous Once Pricilla Riffle, MD         Family History    Family History  Problem Relation Age of Onset   Stroke Mother    Diabetes Mother    Cancer - Lung Father    Heart attack Maternal Grandmother    Leukemia Maternal Grandfather    Stroke Paternal Grandmother    He indicated that his mother is deceased. He indicated that his father is deceased. He indicated that his sister is alive. He indicated that his brother is alive. He indicated that his maternal grandmother is deceased. He indicated that his  maternal grandfather is deceased. He indicated that his paternal grandmother is deceased. He indicated that his paternal grandfather is deceased.  Social History    Social History   Socioeconomic History   Marital status: Married    Spouse name: Not on file   Number of children: Not on file   Years of education: Not on file   Highest education level: Not on file  Occupational History   Not on file  Tobacco Use   Smoking status: Never   Smokeless tobacco: Never  Substance and Sexual Activity   Alcohol use: No   Drug use: No   Sexual activity: Not on file  Other Topics Concern   Not on file  Social History Narrative   Not on file   Social Determinants of Health   Financial  Resource Strain: Not on file  Food Insecurity: Not on file  Transportation Needs: Not on file  Physical Activity: Not on file  Stress: Not on file  Social Connections: Not on file  Intimate Partner Violence: Not on file     Review of Systems    General:  No chills, fever, night sweats or weight changes.  Cardiovascular:  No chest pain, dyspnea on exertion, edema, orthopnea, palpitations, paroxysmal nocturnal dyspnea. Dermatological: No rash, lesions/masses Respiratory: No cough, dyspnea Urologic: No hematuria, dysuria Abdominal:   No nausea, vomiting, diarrhea, bright red blood per rectum, melena, or hematemesis Neurologic:  No visual changes, wkns, changes in mental status. All other systems reviewed and are otherwise negative except as noted above.       Physical Exam    VS:  BP 114/60 (BP Location: Left Arm, Patient Position: Sitting, Cuff Size: Normal)   Pulse 67   Ht 6' (1.829 m)   Wt 238 lb 6.4 oz (108.1 kg)   SpO2 97%   BMI 32.33 kg/m  , BMI Body mass index is 32.33 kg/m.     GEN: Well nourished, well developed, in no acute distress. HEENT: normal. Neck: Supple, no JVD, carotid bruits, or masses. Cardiac: RRR, 2/6 systolic murmur, heard best at the LSB, soft miurmur at the RSB, no  rubs, or gallops. No clubbing, cyanosis, edema.  Radials/DP/PT 2+ and equal bilaterally.  Respiratory:  Respirations regular and unlabored, clear to auscultation bilaterally. GI: Soft, nontender, nondistended, BS + x 4. MS: no deformity or atrophy. Skin: warm and dry, no rash. Neuro:  Strength and sensation are intact. Psych: Normal affect.      Lab Results  Component Value Date   WBC 14.5 (H) 08/24/2022   HGB 12.9 (L) 08/24/2022   HCT 38.4 (L) 08/24/2022   MCV 85.1 08/24/2022   PLT 277 08/24/2022   Lab Results  Component Value Date   CREATININE 0.80 08/24/2022   BUN 13 08/24/2022   NA 138 08/24/2022   K 4.2 08/24/2022   CL 104 08/24/2022   CO2 29 08/24/2022   Lab  Results  Component Value Date   ALT 25 08/24/2022   AST 17 08/24/2022   ALKPHOS 61 08/24/2022   BILITOT 0.6 08/24/2022   Lab Results  Component Value Date   CHOL 139 04/01/2015   HDL 40 04/01/2015   LDLCALC 73 04/01/2015   TRIG 132 04/01/2015   CHOLHDL 3.5 04/01/2015    No results found for: "HGBA1C"   Review of Prior Studies    Echocardiogram 11/04/2020 1. Left ventricular ejection fraction, by estimation, is 60 to 65%. The  left ventricle has normal function. The left ventricle has  no regional  wall motion abnormalities. There is mild focal basal septal left  ventricular hypertrophy. Left ventricular  diastolic parameters are consistent with Grade I diastolic dysfunction  (impaired relaxation).   2. Right ventricular systolic function is normal. The right ventricular  size is normal. Tricuspid regurgitation signal is inadequate for assessing  PA pressure.   3. Left atrial size was mildly dilated.   4. Trivial mitral valve regurgitation. Mild mitral stenosis. The mean  mitral valve gradient is 7.0 mmHg but MVA by VTI is 2.15 cm^2. Moderate  mitral annular calcification.   5. The aortic valve is tricuspid. Aortic valve regurgitation is not  visualized. Mild to moderate aortic valve sclerosis/calcification is  present, without any evidence of aortic stenosis.   6. Aortic dilatation noted. There is mild dilatation of the aortic root,  measuring 38 mm.   7. The inferior vena cava is normal in size with greater than 50%  respiratory variability, suggesting right atrial pressure of 3 mmHg.    Assessment & Plan   1.  Nonobstructive coronary artery disease: The patient is completely asymptomatic.  He remains active denying chest discomfort dizziness fatigue.  Continue secondary management with statin therapy blood pressure control, low-cholesterol diet and purposeful exercise.  2.  Sinus arrhythmia: Noted on EKG today.  Josue Hector with heart rate of 61 bpm.  The patient is  completely asymptomatic, denies dizziness chest pain or near syncope.  I have talked with him about placing cardiac monitor to evaluate frequency of Weinkebach.  He would like to wait on wearing monitor for now.  He will report any new symptoms or symptoms of significant fatigue, dizziness or presyncope ASAP.  I will need to see Dr. Tresa Endo in 6 months instead of 1 year for ongoing management and follow-up and repeat EKG.  Happy to place monitor on him if he chooses to wear this sooner than the appointment.  3.  Hypertension: Excellent control of blood pressure today.  No changes in his medication regimen.  Medications are provided by PCP he will continue losartan HCTZ 50/12.5 mg daily.  Labs are also followed by PCP.  4.  Hypercholesterolemia: Goal of LDL less than 70.  Continue atorvastatin 80 mg daily.  Labs are followed by PCP.         Signed, Bettey Mare. Liborio Nixon, ANP, AACC   09/28/2022 8:53 AM      Office [phone] Fax [phone]  Notice: This dictation was prepared with Dragon dictation along with smaller phrase technology. Any transcriptional errors that result from this process are unintentional and may not be corrected upon review.

## 2022-09-28 ENCOUNTER — Encounter: Payer: Self-pay | Admitting: Adult Health

## 2022-09-28 ENCOUNTER — Ambulatory Visit: Payer: Medicare Other | Admitting: Adult Health

## 2022-09-28 VITALS — BP 114/60 | HR 67 | Ht 72.0 in | Wt 238.4 lb

## 2022-09-28 DIAGNOSIS — I10 Essential (primary) hypertension: Secondary | ICD-10-CM | POA: Diagnosis not present

## 2022-09-28 DIAGNOSIS — E1165 Type 2 diabetes mellitus with hyperglycemia: Secondary | ICD-10-CM | POA: Diagnosis not present

## 2022-09-28 NOTE — Patient Instructions (Signed)
Medication Instructions:  No changes *If you need a refill on your cardiac medications before your next appointment, please call your pharmacy*   Lab Work: No labs If you have labs (blood work) drawn today and your tests are completely normal, you will receive your results only by: MyChart Message (if you have MyChart) OR A paper copy in the mail If you have any lab test that is abnormal or we need to change your treatment, we will call you to review the results.   Testing/Procedures: No Testing   Follow-Up: At [facility], you and your health needs are our priority.  As part of our continuing mission to provide you with exceptional heart care, we have created designated Provider Care Teams.  These Care Teams include your primary Cardiologist (physician) and Advanced Practice Providers (APPs -  Physician Assistants and Nurse Practitioners) who all work together to provide you with the care you need, when you need it.  We recommend signing up for the patient portal called "MyChart".  Sign up information is provided on this After Visit Summary.  MyChart is used to connect with patients for Virtual Visits (Telemedicine).  Patients are able to view lab/test results, encounter notes, upcoming appointments, etc.  Non-urgent messages can be sent to your provider as well.   To learn more about what you can do with MyChart, go to [email or web address].    Your next appointment:   6 month(s)  Provider:   Nicki Guadalajara, MD

## 2022-10-05 DIAGNOSIS — E785 Hyperlipidemia, unspecified: Secondary | ICD-10-CM | POA: Diagnosis not present

## 2022-10-05 DIAGNOSIS — E1165 Type 2 diabetes mellitus with hyperglycemia: Secondary | ICD-10-CM | POA: Diagnosis not present

## 2022-10-05 DIAGNOSIS — I10 Essential (primary) hypertension: Secondary | ICD-10-CM | POA: Diagnosis not present

## 2022-10-05 DIAGNOSIS — H3500 Unspecified background retinopathy: Secondary | ICD-10-CM | POA: Diagnosis not present

## 2022-10-18 DIAGNOSIS — E119 Type 2 diabetes mellitus without complications: Secondary | ICD-10-CM | POA: Diagnosis not present

## 2022-10-30 DIAGNOSIS — Z23 Encounter for immunization: Secondary | ICD-10-CM | POA: Diagnosis not present

## 2022-11-17 DIAGNOSIS — E119 Type 2 diabetes mellitus without complications: Secondary | ICD-10-CM | POA: Diagnosis not present

## 2022-12-18 DIAGNOSIS — E119 Type 2 diabetes mellitus without complications: Secondary | ICD-10-CM | POA: Diagnosis not present

## 2022-12-28 DIAGNOSIS — E1142 Type 2 diabetes mellitus with diabetic polyneuropathy: Secondary | ICD-10-CM | POA: Diagnosis not present

## 2022-12-28 DIAGNOSIS — E7800 Pure hypercholesterolemia, unspecified: Secondary | ICD-10-CM | POA: Diagnosis not present

## 2022-12-28 DIAGNOSIS — I10 Essential (primary) hypertension: Secondary | ICD-10-CM | POA: Diagnosis not present

## 2022-12-28 DIAGNOSIS — E669 Obesity, unspecified: Secondary | ICD-10-CM | POA: Diagnosis not present

## 2023-01-11 DIAGNOSIS — E1165 Type 2 diabetes mellitus with hyperglycemia: Secondary | ICD-10-CM | POA: Diagnosis not present

## 2023-01-17 DIAGNOSIS — E119 Type 2 diabetes mellitus without complications: Secondary | ICD-10-CM | POA: Diagnosis not present

## 2023-01-18 DIAGNOSIS — I10 Essential (primary) hypertension: Secondary | ICD-10-CM | POA: Diagnosis not present

## 2023-01-18 DIAGNOSIS — H3500 Unspecified background retinopathy: Secondary | ICD-10-CM | POA: Diagnosis not present

## 2023-01-18 DIAGNOSIS — E1165 Type 2 diabetes mellitus with hyperglycemia: Secondary | ICD-10-CM | POA: Diagnosis not present

## 2023-01-18 DIAGNOSIS — E785 Hyperlipidemia, unspecified: Secondary | ICD-10-CM | POA: Diagnosis not present

## 2023-02-17 DIAGNOSIS — E119 Type 2 diabetes mellitus without complications: Secondary | ICD-10-CM | POA: Diagnosis not present

## 2023-02-22 ENCOUNTER — Inpatient Hospital Stay: Payer: Medicare Other | Admitting: Internal Medicine

## 2023-02-22 ENCOUNTER — Inpatient Hospital Stay: Payer: Medicare Other | Attending: Internal Medicine

## 2023-02-22 VITALS — BP 144/54 | HR 52 | Temp 97.7°F | Resp 16 | Ht 72.0 in | Wt 230.8 lb

## 2023-02-22 DIAGNOSIS — Z7982 Long term (current) use of aspirin: Secondary | ICD-10-CM | POA: Diagnosis not present

## 2023-02-22 DIAGNOSIS — E119 Type 2 diabetes mellitus without complications: Secondary | ICD-10-CM | POA: Insufficient documentation

## 2023-02-22 DIAGNOSIS — D72820 Lymphocytosis (symptomatic): Secondary | ICD-10-CM

## 2023-02-22 DIAGNOSIS — R110 Nausea: Secondary | ICD-10-CM | POA: Diagnosis not present

## 2023-02-22 DIAGNOSIS — I10 Essential (primary) hypertension: Secondary | ICD-10-CM | POA: Insufficient documentation

## 2023-02-22 DIAGNOSIS — Z7985 Long-term (current) use of injectable non-insulin antidiabetic drugs: Secondary | ICD-10-CM | POA: Diagnosis not present

## 2023-02-22 DIAGNOSIS — Z79899 Other long term (current) drug therapy: Secondary | ICD-10-CM | POA: Insufficient documentation

## 2023-02-22 DIAGNOSIS — Z7984 Long term (current) use of oral hypoglycemic drugs: Secondary | ICD-10-CM | POA: Insufficient documentation

## 2023-02-22 DIAGNOSIS — E7800 Pure hypercholesterolemia, unspecified: Secondary | ICD-10-CM | POA: Diagnosis not present

## 2023-02-22 DIAGNOSIS — C9110 Chronic lymphocytic leukemia of B-cell type not having achieved remission: Secondary | ICD-10-CM | POA: Insufficient documentation

## 2023-02-22 LAB — CMP (CANCER CENTER ONLY)
ALT: 40 U/L (ref 0–44)
AST: 20 U/L (ref 15–41)
Albumin: 4.3 g/dL (ref 3.5–5.0)
Alkaline Phosphatase: 63 U/L (ref 38–126)
Anion gap: 7 (ref 5–15)
BUN: 16 mg/dL (ref 8–23)
CO2: 28 mmol/L (ref 22–32)
Calcium: 9.4 mg/dL (ref 8.9–10.3)
Chloride: 101 mmol/L (ref 98–111)
Creatinine: 0.85 mg/dL (ref 0.61–1.24)
GFR, Estimated: >60 mL/min (ref >60)
Glucose, Bld: 170 mg/dL — ABNORMAL HIGH (ref 70–99)
Potassium: 3.8 mmol/L (ref 3.5–5.1)
Sodium: 136 mmol/L (ref 135–145)
Total Bilirubin: 0.7 mg/dL (ref 0.0–1.2)
Total Protein: 6.9 g/dL (ref 6.5–8.1)

## 2023-02-22 LAB — CBC WITH DIFFERENTIAL (CANCER CENTER ONLY)
Abs Immature Granulocytes: 0.06 10*3/uL (ref 0.00–0.07)
Basophils Absolute: 0.1 10*3/uL (ref 0.0–0.1)
Basophils Relative: 1 %
Eosinophils Absolute: 0.3 10*3/uL (ref 0.0–0.5)
Eosinophils Relative: 2 %
HCT: 40.1 % (ref 39.0–52.0)
Hemoglobin: 13.8 g/dL (ref 13.0–17.0)
Immature Granulocytes: 0 %
Lymphocytes Relative: 43 %
Lymphs Abs: 6.4 10*3/uL — ABNORMAL HIGH (ref 0.7–4.0)
MCH: 29 pg (ref 26.0–34.0)
MCHC: 34.4 g/dL (ref 30.0–36.0)
MCV: 84.2 fL (ref 80.0–100.0)
Monocytes Absolute: 1 10*3/uL (ref 0.1–1.0)
Monocytes Relative: 7 %
Neutro Abs: 7 10*3/uL (ref 1.7–7.7)
Neutrophils Relative %: 47 %
Platelet Count: 277 10*3/uL (ref 150–400)
RBC: 4.76 MIL/uL (ref 4.22–5.81)
RDW: 13.3 % (ref 11.5–15.5)
WBC Count: 14.9 10*3/uL — ABNORMAL HIGH (ref 4.0–10.5)
nRBC: 0 % (ref 0.0–0.2)

## 2023-02-22 LAB — LACTATE DEHYDROGENASE: LDH: 162 U/L (ref 98–192)

## 2023-02-22 NOTE — Progress Notes (Signed)
 [facility] [phone]   [phone]  OFFICE PROGRESS NOTE  Arloa Elsie SAUNDERS, MD [phone] 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   PRIOR THERAPY: None  CURRENT THERAPY: Observation  INTERVAL HISTORY: Mike Taylor 68 y.o. male returns to the clinic today for 31-month follow-up visit.Discussed the use of AI scribe software for clinical note transcription with the patient, who gave verbal consent to proceed.  History of Present Illness   Mike Taylor, a 68 year old patient diagnosed with Chronic Lymphocytic Leukemia (CLL) in July 2024, has been under observation since diagnosis. The patient reports no new complaints or symptoms since the last consultation six months ago. He denies experiencing chest pain, breathing issues, bleeding, or unintentional weight loss. He also denies palpable lymph nodes in the neck or under the arm.  However, the patient does report experiencing nausea, which he attributes to his current medications, including Magerro and Metformin . He has identified a pattern to this nausea, indicating an understanding of its connection to his medication regimen.  The patient's blood counts have remained consistent over the past six months, with a total white blood count fluctuating slightly but remaining within the same range. Hemoglobin, hematocrit, and platelet counts have also remained stable and within normal limits.       MEDICAL HISTORY: Past Medical History:  Diagnosis Date   Diabetes mellitus without complication (HCC)    Hypercholesterolemia    Hypertension     ALLERGIES:  has no known allergies.  MEDICATIONS:  Current Outpatient Medications  Medication Sig Dispense Refill   aspirin EC 81 MG tablet Take 81 mg by mouth daily with breakfast.     atorvastatin  (LIPITOR) 80 MG tablet Take 1 tablet (80 mg total) by mouth daily.  90 tablet 3   Dulaglutide  (TRULICITY ) 4.5 MG/0.5ML SOPN Inject 4.5 mg into the skin once a week 2 mL 2   gabapentin  (NEURONTIN ) 300 MG capsule Take 300 mg by mouth 3 (three) times daily.     glipiZIDE (GLUCOTROL XL) 2.5 MG 24 hr tablet Take 2.5 mg by mouth every morning.     lansoprazole  (PREVACID ) 30 MG capsule Take 30 mg by mouth daily with breakfast.     losartan -hydrochlorothiazide  (HYZAAR ) 50-12.5 MG tablet Take 1 tablet by mouth daily.     metFORMIN  (GLUCOPHAGE ) 500 MG tablet Take 500 mg by mouth 2 (two) times daily with a meal.     metoprolol  succinate (TOPROL -XL) 100 MG 24 hr tablet TAKE 1 TABLET DAILY TAKE   WITH OR IMMEDIATELY        FOLLOWING A MEAL 90 tablet 3   pioglitazone (ACTOS) 30 MG tablet Take 30 mg by mouth daily.     pregabalin  (LYRICA ) 100 MG capsule Take 100 mg by mouth daily.     terazosin  (HYTRIN ) 5 MG capsule Take 5 mg by mouth daily with breakfast.     No current facility-administered medications for this visit.   Facility-Administered Medications Ordered in Other Visits  Medication Dose Route Frequency Provider Last Rate Last Admin   regadenoson  (LEXISCAN ) injection SOLN 0.4 mg  0.4 mg Intravenous Once Okey Vina GAILS, MD        SURGICAL HISTORY: No past surgical history on file.  REVIEW OF SYSTEMS:  A comprehensive review of systems was negative.   PHYSICAL EXAMINATION: General appearance: alert, cooperative, and no distress Head: Normocephalic, without obvious abnormality, atraumatic Neck: no  adenopathy, no JVD, supple, symmetrical, trachea midline, and thyroid not enlarged, symmetric, no tenderness/mass/nodules Lymph nodes: Cervical, supraclavicular, and axillary nodes normal. Resp: clear to auscultation bilaterally Back: symmetric, no curvature. ROM normal. No CVA tenderness. Cardio: regular rate and rhythm, S1, S2 normal, no murmur, click, rub or gallop GI: soft, non-tender; bowel sounds normal; no masses,  no organomegaly Extremities: extremities normal,  atraumatic, no cyanosis or edema  ECOG PERFORMANCE STATUS: 0 - Asymptomatic  Blood pressure (!) 144/54, pulse (!) 52, temperature 97.7 F (36.5 C), temperature source Temporal, resp. rate 16, height 6' (1.829 m), weight 230 lb 12.8 oz (104.7 kg), SpO2 100%.  LABORATORY DATA: Lab Results  Component Value Date   WBC 14.9 (H) 02/22/2023   HGB 13.8 02/22/2023   HCT 40.1 02/22/2023   MCV 84.2 02/22/2023   PLT 277 02/22/2023      Chemistry      Component Value Date/Time   NA 138 08/24/2022 0757   K 4.2 08/24/2022 0757   CL 104 08/24/2022 0757   CO2 29 08/24/2022 0757   BUN 13 08/24/2022 0757   CREATININE 0.80 08/24/2022 0757   CREATININE 0.84 04/01/2015 0802      Component Value Date/Time   CALCIUM  9.2 08/24/2022 0757   ALKPHOS 61 08/24/2022 0757   AST 17 08/24/2022 0757   ALT 25 08/24/2022 0757   BILITOT 0.6 08/24/2022 0757       RADIOGRAPHIC STUDIES: No results found.  ASSESSMENT AND PLAN: This is a very pleasant 68 years old white male with likely chronic lymphocytic leukemia (CLL) diagnosed in July 2023.   The patient is currently on observation and he is feeling fine with no concerning symptoms.    Chronic Lymphocytic Leukemia (CLL) Diagnosed in July 2024. Blood counts are well-managed with no significant changes in WBC, hemoglobin, hematocrit, or platelets. No new symptoms such as chest pain, dyspnea, bleeding, lymphadenopathy, or unintentional weight loss. Nausea likely due to Moujaro and metformin . - Monitor blood counts every six months - Schedule follow-up in early July - Ensure lab work is done on a Monday  Medication-Induced Nausea Nausea associated with Moujaro and metformin . Symptoms are predictable. - Continue current medications - Monitor for worsening symptoms or new side effects.   The patient was advised to call immediately if he has any concerning symptoms in the interval. The patient voices understanding of current disease status and treatment  options and is in agreement with the current care plan.  All questions were answered. The patient knows to call the clinic with any problems, questions or concerns. We can certainly see the patient much sooner if necessary.  The total time spent in the appointment was 20 minutes.  Disclaimer: This note was dictated with voice recognition software. Similar sounding words can inadvertently be transcribed and may not be corrected upon review.

## 2023-03-20 DIAGNOSIS — E119 Type 2 diabetes mellitus without complications: Secondary | ICD-10-CM | POA: Diagnosis not present

## 2023-03-29 DIAGNOSIS — K080 Exfoliation of teeth due to systemic causes: Secondary | ICD-10-CM | POA: Diagnosis not present

## 2023-04-12 DIAGNOSIS — H25813 Combined forms of age-related cataract, bilateral: Secondary | ICD-10-CM | POA: Diagnosis not present

## 2023-04-12 DIAGNOSIS — E119 Type 2 diabetes mellitus without complications: Secondary | ICD-10-CM | POA: Diagnosis not present

## 2023-04-12 DIAGNOSIS — H524 Presbyopia: Secondary | ICD-10-CM | POA: Diagnosis not present

## 2023-04-17 DIAGNOSIS — E119 Type 2 diabetes mellitus without complications: Secondary | ICD-10-CM | POA: Diagnosis not present

## 2023-04-19 DIAGNOSIS — E1165 Type 2 diabetes mellitus with hyperglycemia: Secondary | ICD-10-CM | POA: Diagnosis not present

## 2023-04-26 DIAGNOSIS — H3500 Unspecified background retinopathy: Secondary | ICD-10-CM | POA: Diagnosis not present

## 2023-04-26 DIAGNOSIS — E1165 Type 2 diabetes mellitus with hyperglycemia: Secondary | ICD-10-CM | POA: Diagnosis not present

## 2023-04-26 DIAGNOSIS — E785 Hyperlipidemia, unspecified: Secondary | ICD-10-CM | POA: Diagnosis not present

## 2023-04-26 DIAGNOSIS — I10 Essential (primary) hypertension: Secondary | ICD-10-CM | POA: Diagnosis not present

## 2023-05-03 DIAGNOSIS — K080 Exfoliation of teeth due to systemic causes: Secondary | ICD-10-CM | POA: Diagnosis not present

## 2023-05-18 ENCOUNTER — Other Ambulatory Visit (HOSPITAL_COMMUNITY): Payer: Self-pay

## 2023-05-18 DIAGNOSIS — E119 Type 2 diabetes mellitus without complications: Secondary | ICD-10-CM | POA: Diagnosis not present

## 2023-05-18 MED ORDER — GABAPENTIN 300 MG PO CAPS
300.0000 mg | ORAL_CAPSULE | Freq: Two times a day (BID) | ORAL | 0 refills | Status: DC
  Filled 2023-05-18 – 2023-05-19 (×3): qty 60, 30d supply, fill #0

## 2023-05-18 MED ORDER — METFORMIN HCL ER 500 MG PO TB24
1000.0000 mg | ORAL_TABLET | Freq: Every evening | ORAL | 0 refills | Status: AC
Start: 2022-09-21 — End: ?
  Filled 2023-05-18: qty 60, 30d supply, fill #0

## 2023-05-18 MED ORDER — INSULIN GLARGINE (1 UNIT DIAL) 300 UNIT/ML ~~LOC~~ SOPN
PEN_INJECTOR | Freq: Every evening | SUBCUTANEOUS | 5 refills | Status: AC
  Filled 2023-06-06: qty 4.5, 61d supply, fill #0
  Filled 2023-07-17: qty 4.5, 60d supply, fill #0
  Filled 2023-09-20: qty 4.5, 60d supply, fill #1
  Filled 2023-11-18: qty 4.5, 60d supply, fill #2
  Filled 2024-01-17: qty 4.5, 60d supply, fill #3
  Filled 2024-03-15: qty 4.5, 60d supply, fill #4

## 2023-05-18 MED ORDER — DAPAGLIFLOZIN PROPANEDIOL 10 MG PO TABS
10.0000 mg | ORAL_TABLET | Freq: Every day | ORAL | 5 refills | Status: DC
  Filled 2023-05-18 – 2023-06-19 (×2): qty 30, 30d supply, fill #0
  Filled 2023-07-17: qty 30, 30d supply, fill #1
  Filled 2023-08-20: qty 30, 30d supply, fill #2
  Filled 2023-09-24: qty 30, 30d supply, fill #3
  Filled 2023-10-24: qty 30, 30d supply, fill #4

## 2023-05-18 MED ORDER — MOUNJARO 10 MG/0.5ML ~~LOC~~ SOAJ
10.0000 mg | SUBCUTANEOUS | 1 refills | Status: DC
  Filled 2023-05-18 (×2): qty 2, 28d supply, fill #0
  Filled 2023-06-06 – 2023-06-11 (×2): qty 2, 28d supply, fill #1

## 2023-05-18 MED ORDER — METOPROLOL SUCCINATE ER 50 MG PO TB24
50.0000 mg | ORAL_TABLET | Freq: Every day | ORAL | 1 refills | Status: AC
Start: 2023-03-25 — End: ?
  Filled 2023-05-18 – 2023-09-13 (×5): qty 90, 90d supply, fill #0
  Filled 2023-12-08: qty 90, 90d supply, fill #1

## 2023-05-19 ENCOUNTER — Other Ambulatory Visit (HOSPITAL_COMMUNITY): Payer: Self-pay

## 2023-05-25 ENCOUNTER — Other Ambulatory Visit: Payer: Self-pay

## 2023-06-07 ENCOUNTER — Other Ambulatory Visit (HOSPITAL_COMMUNITY): Payer: Self-pay

## 2023-06-11 ENCOUNTER — Other Ambulatory Visit (HOSPITAL_COMMUNITY): Payer: Self-pay

## 2023-06-11 ENCOUNTER — Other Ambulatory Visit: Payer: Self-pay

## 2023-06-11 MED ORDER — TRULICITY 1.5 MG/0.5ML ~~LOC~~ SOAJ: 1.5000 mg | SUBCUTANEOUS | 1 refills | Status: DC

## 2023-06-11 MED ORDER — METFORMIN HCL ER 500 MG PO TB24
1000.0000 mg | ORAL_TABLET | Freq: Every evening | ORAL | 1 refills | Status: AC
  Filled 2023-06-11: qty 180, 90d supply, fill #0

## 2023-06-11 MED ORDER — METFORMIN HCL ER 500 MG PO TB24
500.0000 mg | ORAL_TABLET | Freq: Two times a day (BID) | ORAL | 3 refills | Status: DC
  Filled 2023-08-19: qty 180, 90d supply, fill #0

## 2023-06-11 MED ORDER — TRULICITY 1.5 MG/0.5ML ~~LOC~~ SOAJ
1.5000 mg | SUBCUTANEOUS | 1 refills | Status: DC
  Filled 2023-06-11: qty 2, 28d supply, fill #0

## 2023-06-11 MED ORDER — GABAPENTIN 300 MG PO CAPS
300.0000 mg | ORAL_CAPSULE | Freq: Two times a day (BID) | ORAL | 1 refills | Status: DC
  Filled 2023-06-11: qty 60, 30d supply, fill #0
  Filled 2023-07-17: qty 60, 30d supply, fill #1

## 2023-06-11 MED ORDER — TRULICITY 4.5 MG/0.5ML ~~LOC~~ SOAJ: 4.5000 mg | SUBCUTANEOUS | 1 refills | Status: DC

## 2023-06-11 MED ORDER — TRULICITY 4.5 MG/0.5ML ~~LOC~~ SOAJ: 4.5000 mg | SUBCUTANEOUS | 3 refills | Status: DC

## 2023-06-11 MED ORDER — INSULIN GLARGINE (1 UNIT DIAL) 300 UNIT/ML ~~LOC~~ SOPN
18.0000 [IU] | PEN_INJECTOR | Freq: Every evening | SUBCUTANEOUS | 5 refills | Status: AC
  Filled 2023-06-11: qty 3, 50d supply, fill #0

## 2023-06-11 MED ORDER — LOSARTAN POTASSIUM-HCTZ 100-25 MG PO TABS: 1.0000 | ORAL_TABLET | Freq: Every day | ORAL | 1 refills | Status: AC

## 2023-06-11 MED ORDER — TERAZOSIN HCL 5 MG PO CAPS
5.0000 mg | ORAL_CAPSULE | Freq: Every day | ORAL | 3 refills | Status: DC
  Filled 2023-06-11: qty 90, 90d supply, fill #0

## 2023-06-11 MED ORDER — METOPROLOL SUCCINATE ER 50 MG PO TB24
50.0000 mg | ORAL_TABLET | Freq: Every day | ORAL | 3 refills | Status: AC
  Filled 2023-06-11: qty 90, 90d supply, fill #0

## 2023-06-11 MED ORDER — LANSOPRAZOLE 30 MG PO CPDR
30.0000 mg | DELAYED_RELEASE_CAPSULE | Freq: Every day | ORAL | 3 refills | Status: DC
  Filled 2023-06-11: qty 90, 90d supply, fill #0

## 2023-06-11 MED ORDER — MOUNJARO 2.5 MG/0.5ML ~~LOC~~ SOAJ: 2.5000 mg | SUBCUTANEOUS | 1 refills | Status: AC

## 2023-06-14 ENCOUNTER — Other Ambulatory Visit: Payer: Self-pay

## 2023-06-15 ENCOUNTER — Other Ambulatory Visit (HOSPITAL_COMMUNITY): Payer: Self-pay

## 2023-06-17 DIAGNOSIS — E119 Type 2 diabetes mellitus without complications: Secondary | ICD-10-CM | POA: Diagnosis not present

## 2023-06-19 ENCOUNTER — Other Ambulatory Visit (HOSPITAL_COMMUNITY): Payer: Self-pay

## 2023-07-10 ENCOUNTER — Other Ambulatory Visit (HOSPITAL_COMMUNITY): Payer: Self-pay

## 2023-07-13 ENCOUNTER — Other Ambulatory Visit (HOSPITAL_COMMUNITY): Payer: Self-pay

## 2023-07-13 MED ORDER — MOUNJARO 10 MG/0.5ML ~~LOC~~ SOAJ
10.0000 mg | SUBCUTANEOUS | 3 refills | Status: DC
  Filled 2023-07-13: qty 2, 28d supply, fill #0
  Filled 2023-08-04: qty 2, 28d supply, fill #1
  Filled 2023-09-04: qty 2, 28d supply, fill #2
  Filled 2023-10-02: qty 2, 28d supply, fill #3

## 2023-07-17 ENCOUNTER — Other Ambulatory Visit (HOSPITAL_COMMUNITY): Payer: Self-pay

## 2023-07-18 DIAGNOSIS — E119 Type 2 diabetes mellitus without complications: Secondary | ICD-10-CM | POA: Diagnosis not present

## 2023-07-21 NOTE — Progress Notes (Signed)
 [facility] OFFICE PROGRESS NOTE  Roselind Congo, MD [address] [place] [address]  DIAGNOSIS: Monoclonal B-cell lymphocytosis suspicious for CLL diagnosed in July 2023.   PRIOR THERAPY: None  CURRENT THERAPY: Observation   INTERVAL HISTORY: Mike Taylor 68 y.o. male returns to the clinic today for a 62-month follow-up visit.  The patient is followed for CLL.  The patient is currently on observation and feeling well.  The patient denies any changes in his health since he was last seen 6 months ago.  He denies any fever, chills, or night sweats. He lost a few pounds. He has some mild allergies. However, he does not take anything. He used to take zyrtect but he did not notice appreciable improvement.  He denies any lymphadenopathy.  He denies any signs and symptoms of infection including  sore throat, skin infections, dysuria, nausea, or unusual diarrhea. He experiences diarrhea intermittently secondary to Mounjaro . He denies any abnormal bleeding or bruising.   Denies any lightheadedness unless bending over. He follows with his PCP and endocrinologist. He is here today for evaluation and repeat blood work.   MEDICAL HISTORY: Past Medical History:  Diagnosis Date   Diabetes mellitus without complication (HCC)    Hypercholesterolemia    Hypertension     ALLERGIES:  has no known allergies.  MEDICATIONS:  Current Outpatient Medications  Medication Sig Dispense Refill   aspirin EC 81 MG tablet Take 81 mg by mouth daily with breakfast.     atorvastatin  (LIPITOR) 80 MG tablet Take 1 tablet (80 mg total) by mouth daily. 90 tablet 3   dapagliflozin  propanediol (FARXIGA ) 10 MG TABS tablet Take 1 tablet (10 mg total) by mouth daily. 30 tablet 5   gabapentin  (NEURONTIN ) 300 MG capsule Take 300 mg by mouth 3 (three) times daily.     gabapentin  (NEURONTIN ) 300 MG capsule Take 1 capsule (300 mg total) by mouth 2 (two) times daily. 60 capsule 1   insulin   glargine, 1 Unit Dial , (TOUJEO ) 300 UNIT/ML Solostar Pen Inject 22 units into the skin every evening. 4.5 mL 5   insulin  glargine, 1 Unit Dial , (TOUJEO ) 300 UNIT/ML Solostar Pen Inject 18 Units into the skin every evening. 3 mL 5   lansoprazole  (PREVACID ) 30 MG capsule Take 30 mg by mouth daily with breakfast.     lansoprazole  (PREVACID ) 30 MG capsule Take 1 capsule (30 mg total) by mouth daily. 90 capsule 3   losartan -hydrochlorothiazide (HYZAAR) 100-25 MG tablet Take 1 tablet by mouth daily. 90 tablet 1   losartan -hydrochlorothiazide (HYZAAR) 50-12.5 MG tablet Take 1 tablet by mouth daily.     metFORMIN  (GLUCOPHAGE ) 500 MG tablet Take 500 mg by mouth 2 (two) times daily with a meal.     metFORMIN  (GLUCOPHAGE -XR) 500 MG 24 hr tablet Take 2 tablets (1,000 mg total) by mouth every evening. 60 tablet 0   metFORMIN  (GLUCOPHAGE -XR) 500 MG 24 hr tablet Take 2 tablets (1,000 mg total) by mouth every evening. 180 tablet 1   metFORMIN  (GLUCOPHAGE -XR) 500 MG 24 hr tablet Take 1 tablet (500 mg total) by mouth 2 (two) times daily. 180 tablet 3   metoprolol  succinate (TOPROL -XL) 100 MG 24 hr tablet TAKE 1 TABLET DAILY TAKE   WITH OR IMMEDIATELY        FOLLOWING A MEAL 90 tablet 3   metoprolol  succinate (TOPROL -XL) 50 MG 24 hr tablet Take 1 tablet (50 mg total) by mouth daily. 90 tablet 1  metoprolol  succinate (TOPROL -XL) 50 MG 24 hr tablet Take 1 tablet (50 mg total) by mouth daily. 90 tablet 3   pioglitazone (ACTOS) 30 MG tablet Take 15 mg by mouth daily. 1/2 tablet     pregabalin (LYRICA) 100 MG capsule Take 100 mg by mouth daily.     terazosin  (HYTRIN ) 5 MG capsule Take 5 mg by mouth daily with breakfast.     terazosin  (HYTRIN ) 5 MG capsule Take 1 capsule (5 mg total) by mouth daily. 90 capsule 3   tirzepatide  (MOUNJARO ) 10 MG/0.5ML Pen Inject 10 mg into the skin once a week. 2 mL 3   tirzepatide  (MOUNJARO ) 2.5 MG/0.5ML Pen Inject 2.5 mg into the skin once a week. 2 mL 1   Dulaglutide  (TRULICITY ) 4.5  MG/0.5ML SOPN Inject 4.5 mg into the skin once a week 2 mL 2   glipiZIDE (GLUCOTROL XL) 2.5 MG 24 hr tablet Take 2.5 mg by mouth every morning. (Patient not taking: Reported on 07/26/2023)     No current facility-administered medications for this visit.   Facility-Administered Medications Ordered in Other Visits  Medication Dose Route Frequency Provider Last Rate Last Admin   regadenoson  (LEXISCAN ) injection SOLN 0.4 mg  0.4 mg Intravenous Once Elmyra Haggard, MD        SURGICAL HISTORY: No past surgical history on file.  REVIEW OF SYSTEMS:   Review of Systems  Constitutional: Positive for few pound weight loss. Negative for appetite change, chills, fatigue, or fever.  HENT: Negative for mouth sores, nosebleeds, sore throat and trouble swallowing.   Eyes: Negative for eye problems and icterus.  Respiratory: Negative for cough, hemoptysis, shortness of breath and wheezing.   Cardiovascular: Negative for chest pain and leg swelling.  Gastrointestinal: Negative for abdominal pain, constipation, diarrhea (none at this time), nausea and vomiting.  Genitourinary: Negative for bladder incontinence, difficulty urinating, dysuria, frequency and hematuria.   Musculoskeletal: Negative for back pain, gait problem, neck pain and neck stiffness.  Skin: Negative for itching and rash.  Neurological: Negative for dizziness, extremity weakness, gait problem, headaches, light-headedness and seizures.  Hematological: Negative for adenopathy. Does not bruise/bleed easily.  Psychiatric/Behavioral: Negative for confusion, depression and sleep disturbance. The patient is not nervous/anxious.     PHYSICAL EXAMINATION:  Blood pressure 122/83, pulse 65, temperature 98.1 F (36.7 C), temperature source Temporal, resp. rate 13, weight 221 lb 6.4 oz (100.4 kg), SpO2 100%.  ECOG PERFORMANCE STATUS: 1  Physical Exam  Constitutional: Oriented to person, place, and time and well-developed, well-nourished, and in no  distress.  HENT:  Head: Normocephalic and atraumatic.  Mouth/Throat: Oropharynx is clear and moist. No oropharyngeal exudate.  Eyes: Conjunctivae are normal. Right eye exhibits no discharge. Left eye exhibits no discharge. No scleral icterus.  Neck: Normal range of motion. Neck supple.  Cardiovascular: Normal rate, regular rhythm, normal heart sounds and intact distal pulses.   Pulmonary/Chest: Effort normal and breath sounds normal. No respiratory distress. No wheezes. No rales.  Abdominal: Soft. Bowel sounds are normal. Exhibits no distension and no mass. There is no tenderness.  Musculoskeletal: Normal range of motion. Exhibits no edema.  Lymphadenopathy:    No cervical adenopathy.  Neurological: Alert and oriented to person, place, and time. Exhibits normal muscle tone. Gait normal. Coordination normal.  Skin: Skin is warm and dry. No rash noted. Not diaphoretic. No erythema. No pallor.  Psychiatric: Mood, memory and judgment normal.  Vitals reviewed.  LABORATORY DATA: Lab Results  Component Value Date   WBC 16.6 (  H) 07/26/2023   HGB 13.4 07/26/2023   HCT 38.7 (L) 07/26/2023   MCV 83.2 07/26/2023   PLT 294 07/26/2023      Chemistry      Component Value Date/Time   NA 137 07/26/2023 0807   K 4.3 07/26/2023 0807   CL 104 07/26/2023 0807   CO2 27 07/26/2023 0807   BUN 28 (H) 07/26/2023 0807   CREATININE 0.96 07/26/2023 0807   CREATININE 0.84 04/01/2015 0802      Component Value Date/Time   CALCIUM  9.6 07/26/2023 0807   ALKPHOS 62 07/26/2023 0807   AST 23 07/26/2023 0807   ALT 30 07/26/2023 0807   BILITOT 0.7 07/26/2023 0807       RADIOGRAPHIC STUDIES:  No results found.   ASSESSMENT/PLAN:  This is a very pleasant 68 year old Caucasian male with CLL diagnosed in July 2023.  The patient is currently on observation and feeling fine.  The patient had a CBC performed today which shows WBC of 16.6 which is fairly stable. His CMP and LDH are pending.   We recommend  he continue on observation with repeat labs in 6 months.  The patient was advised to call immediately if he has any concerning symptoms in the interval. The patient voices understanding of current disease status and treatment options and is in agreement with the current care plan. All questions were answered. The patient knows to call the clinic with any problems, questions or concerns. We can certainly see the patient much sooner if necessary   Orders Placed This Encounter  Procedures   CBC with Differential (Cancer Center Only)    Standing Status:   Future    Expected Date:   01/25/2024    Expiration Date:   07/25/2024   Lactate dehydrogenase (LDH)    Standing Status:   Future    Expected Date:   01/25/2024    Expiration Date:   07/25/2024   CMP (Cancer Center only)    Standing Status:   Future    Expected Date:   01/25/2024    Expiration Date:   07/25/2024     The total time spent in the appointment was 20-29 minutes  Dianca Owensby L Judithe Keetch, PA-C 07/26/23

## 2023-07-22 ENCOUNTER — Other Ambulatory Visit: Payer: Self-pay

## 2023-07-26 ENCOUNTER — Inpatient Hospital Stay: Admitting: Physician Assistant

## 2023-07-26 ENCOUNTER — Inpatient Hospital Stay: Payer: Medicare Other | Attending: Internal Medicine

## 2023-07-26 ENCOUNTER — Ambulatory Visit: Payer: Medicare Other | Admitting: Internal Medicine

## 2023-07-26 VITALS — BP 122/83 | HR 65 | Temp 98.1°F | Resp 13 | Wt 221.4 lb

## 2023-07-26 DIAGNOSIS — Z7982 Long term (current) use of aspirin: Secondary | ICD-10-CM | POA: Insufficient documentation

## 2023-07-26 DIAGNOSIS — C9110 Chronic lymphocytic leukemia of B-cell type not having achieved remission: Secondary | ICD-10-CM | POA: Insufficient documentation

## 2023-07-26 DIAGNOSIS — Z79899 Other long term (current) drug therapy: Secondary | ICD-10-CM | POA: Diagnosis not present

## 2023-07-26 DIAGNOSIS — D72820 Lymphocytosis (symptomatic): Secondary | ICD-10-CM

## 2023-07-26 LAB — CMP (CANCER CENTER ONLY)
ALT: 30 U/L (ref 0–44)
AST: 23 U/L (ref 15–41)
Albumin: 4.6 g/dL (ref 3.5–5.0)
Alkaline Phosphatase: 62 U/L (ref 38–126)
Anion gap: 6 (ref 5–15)
BUN: 28 mg/dL — ABNORMAL HIGH (ref 8–23)
CO2: 27 mmol/L (ref 22–32)
Calcium: 9.6 mg/dL (ref 8.9–10.3)
Chloride: 104 mmol/L (ref 98–111)
Creatinine: 0.96 mg/dL (ref 0.61–1.24)
GFR, Estimated: >60 mL/min (ref >60)
Glucose, Bld: 154 mg/dL — ABNORMAL HIGH (ref 70–99)
Potassium: 4.3 mmol/L (ref 3.5–5.1)
Sodium: 137 mmol/L (ref 135–145)
Total Bilirubin: 0.7 mg/dL (ref 0.0–1.2)
Total Protein: 7.2 g/dL (ref 6.5–8.1)

## 2023-07-26 LAB — CBC WITH DIFFERENTIAL (CANCER CENTER ONLY)
Abs Immature Granulocytes: 0.09 10*3/uL — ABNORMAL HIGH (ref 0.00–0.07)
Basophils Absolute: 0.1 10*3/uL (ref 0.0–0.1)
Basophils Relative: 1 %
Eosinophils Absolute: 0.3 10*3/uL (ref 0.0–0.5)
Eosinophils Relative: 2 %
HCT: 38.7 % — ABNORMAL LOW (ref 39.0–52.0)
Hemoglobin: 13.4 g/dL (ref 13.0–17.0)
Immature Granulocytes: 1 %
Lymphocytes Relative: 37 %
Lymphs Abs: 6.2 10*3/uL — ABNORMAL HIGH (ref 0.7–4.0)
MCH: 28.8 pg (ref 26.0–34.0)
MCHC: 34.6 g/dL (ref 30.0–36.0)
MCV: 83.2 fL (ref 80.0–100.0)
Monocytes Absolute: 1 10*3/uL (ref 0.1–1.0)
Monocytes Relative: 6 %
Neutro Abs: 8.9 10*3/uL — ABNORMAL HIGH (ref 1.7–7.7)
Neutrophils Relative %: 53 %
Platelet Count: 294 10*3/uL (ref 150–400)
RBC: 4.65 MIL/uL (ref 4.22–5.81)
RDW: 13.9 % (ref 11.5–15.5)
WBC Count: 16.6 10*3/uL — ABNORMAL HIGH (ref 4.0–10.5)
WBC Morphology: ABNORMAL
nRBC: 0 % (ref 0.0–0.2)

## 2023-07-26 LAB — LACTATE DEHYDROGENASE: LDH: 170 U/L (ref 98–192)

## 2023-08-17 DIAGNOSIS — E119 Type 2 diabetes mellitus without complications: Secondary | ICD-10-CM | POA: Diagnosis not present

## 2023-08-18 ENCOUNTER — Other Ambulatory Visit (HOSPITAL_COMMUNITY): Payer: Self-pay

## 2023-08-18 MED ORDER — GABAPENTIN 300 MG PO CAPS
300.0000 mg | ORAL_CAPSULE | Freq: Two times a day (BID) | ORAL | 1 refills | Status: DC
  Filled 2023-08-18 – 2023-08-19 (×2): qty 60, 30d supply, fill #0
  Filled 2023-09-20: qty 60, 30d supply, fill #1

## 2023-08-19 ENCOUNTER — Other Ambulatory Visit (HOSPITAL_COMMUNITY): Payer: Self-pay

## 2023-08-23 ENCOUNTER — Other Ambulatory Visit (HOSPITAL_COMMUNITY): Payer: Self-pay

## 2023-08-25 ENCOUNTER — Other Ambulatory Visit (HOSPITAL_COMMUNITY): Payer: Self-pay

## 2023-08-25 MED ORDER — PREGABALIN 100 MG PO CAPS
100.0000 mg | ORAL_CAPSULE | Freq: Every day | ORAL | 1 refills | Status: AC
  Filled 2023-08-25 – 2023-12-07 (×5): qty 90, 90d supply, fill #0
  Filled 2024-03-06: qty 90, 90d supply, fill #1

## 2023-08-28 ENCOUNTER — Other Ambulatory Visit (HOSPITAL_COMMUNITY): Payer: Self-pay

## 2023-08-30 ENCOUNTER — Other Ambulatory Visit: Payer: Self-pay

## 2023-08-30 ENCOUNTER — Other Ambulatory Visit (HOSPITAL_COMMUNITY): Payer: Self-pay

## 2023-08-30 DIAGNOSIS — E1165 Type 2 diabetes mellitus with hyperglycemia: Secondary | ICD-10-CM | POA: Diagnosis not present

## 2023-08-30 DIAGNOSIS — K219 Gastro-esophageal reflux disease without esophagitis: Secondary | ICD-10-CM | POA: Diagnosis not present

## 2023-08-30 DIAGNOSIS — I10 Essential (primary) hypertension: Secondary | ICD-10-CM | POA: Diagnosis not present

## 2023-08-30 DIAGNOSIS — Z125 Encounter for screening for malignant neoplasm of prostate: Secondary | ICD-10-CM | POA: Diagnosis not present

## 2023-08-30 DIAGNOSIS — N401 Enlarged prostate with lower urinary tract symptoms: Secondary | ICD-10-CM | POA: Diagnosis not present

## 2023-08-30 DIAGNOSIS — Z0000 Encounter for general adult medical examination without abnormal findings: Secondary | ICD-10-CM | POA: Diagnosis not present

## 2023-08-30 DIAGNOSIS — E7800 Pure hypercholesterolemia, unspecified: Secondary | ICD-10-CM | POA: Diagnosis not present

## 2023-08-30 MED ORDER — LOSARTAN POTASSIUM-HCTZ 100-25 MG PO TABS
1.0000 | ORAL_TABLET | Freq: Every day | ORAL | 3 refills | Status: AC
  Filled 2023-08-30 – 2023-09-13 (×2): qty 90, 90d supply, fill #0
  Filled 2023-12-08: qty 90, 90d supply, fill #1
  Filled 2024-03-07: qty 90, 90d supply, fill #2

## 2023-08-30 MED ORDER — TERAZOSIN HCL 5 MG PO CAPS
5.0000 mg | ORAL_CAPSULE | Freq: Every day | ORAL | 3 refills | Status: AC
  Filled 2023-08-30: qty 90, 90d supply, fill #0
  Filled 2023-12-07: qty 90, 90d supply, fill #1
  Filled 2024-03-06: qty 90, 90d supply, fill #2

## 2023-08-30 MED ORDER — LANSOPRAZOLE 30 MG PO CPDR
30.0000 mg | DELAYED_RELEASE_CAPSULE | Freq: Every day | ORAL | 3 refills | Status: AC
  Filled 2023-08-30 – 2023-09-13 (×2): qty 90, 90d supply, fill #0
  Filled 2023-12-08: qty 90, 90d supply, fill #1
  Filled 2024-03-07: qty 90, 90d supply, fill #2

## 2023-08-30 MED ORDER — PREGABALIN 100 MG PO CAPS
100.0000 mg | ORAL_CAPSULE | Freq: Every day | ORAL | 3 refills | Status: AC
  Filled 2023-08-30 – 2023-09-03 (×2): qty 90, 90d supply, fill #0
  Filled 2024-02-28 – 2024-03-07 (×2): qty 90, 90d supply, fill #1

## 2023-08-30 MED ORDER — ATORVASTATIN CALCIUM 80 MG PO TABS
80.0000 mg | ORAL_TABLET | Freq: Every day | ORAL | 3 refills | Status: AC
  Filled 2023-08-30 – 2023-09-13 (×2): qty 90, 90d supply, fill #0
  Filled 2023-12-08: qty 90, 90d supply, fill #1
  Filled 2024-03-07: qty 90, 90d supply, fill #2

## 2023-08-30 MED ORDER — METOPROLOL SUCCINATE ER 50 MG PO TB24
50.0000 mg | ORAL_TABLET | Freq: Every day | ORAL | 3 refills | Status: AC
  Filled 2023-08-30 – 2024-03-07 (×2): qty 90, 90d supply, fill #0

## 2023-09-03 ENCOUNTER — Other Ambulatory Visit (HOSPITAL_COMMUNITY): Payer: Self-pay

## 2023-09-03 ENCOUNTER — Other Ambulatory Visit: Payer: Self-pay

## 2023-09-06 ENCOUNTER — Other Ambulatory Visit: Payer: Self-pay

## 2023-09-06 DIAGNOSIS — E0840 Diabetes mellitus due to underlying condition with diabetic neuropathy, unspecified: Secondary | ICD-10-CM | POA: Diagnosis not present

## 2023-09-06 DIAGNOSIS — E785 Hyperlipidemia, unspecified: Secondary | ICD-10-CM | POA: Diagnosis not present

## 2023-09-06 DIAGNOSIS — E1165 Type 2 diabetes mellitus with hyperglycemia: Secondary | ICD-10-CM | POA: Diagnosis not present

## 2023-09-06 DIAGNOSIS — I10 Essential (primary) hypertension: Secondary | ICD-10-CM | POA: Diagnosis not present

## 2023-09-13 ENCOUNTER — Other Ambulatory Visit (HOSPITAL_COMMUNITY): Payer: Self-pay

## 2023-09-17 DIAGNOSIS — E119 Type 2 diabetes mellitus without complications: Secondary | ICD-10-CM | POA: Diagnosis not present

## 2023-09-25 ENCOUNTER — Other Ambulatory Visit (HOSPITAL_COMMUNITY): Payer: Self-pay

## 2023-10-02 ENCOUNTER — Other Ambulatory Visit (HOSPITAL_COMMUNITY): Payer: Self-pay

## 2023-10-11 DIAGNOSIS — K080 Exfoliation of teeth due to systemic causes: Secondary | ICD-10-CM | POA: Diagnosis not present

## 2023-10-17 ENCOUNTER — Other Ambulatory Visit (HOSPITAL_COMMUNITY): Payer: Self-pay

## 2023-10-18 DIAGNOSIS — E119 Type 2 diabetes mellitus without complications: Secondary | ICD-10-CM | POA: Diagnosis not present

## 2023-10-19 ENCOUNTER — Other Ambulatory Visit (HOSPITAL_COMMUNITY): Payer: Self-pay

## 2023-10-19 MED ORDER — GABAPENTIN 300 MG PO CAPS
300.0000 mg | ORAL_CAPSULE | Freq: Two times a day (BID) | ORAL | 1 refills | Status: DC
  Filled 2023-10-20: qty 60, 30d supply, fill #0
  Filled 2023-11-22: qty 60, 30d supply, fill #1

## 2023-10-20 ENCOUNTER — Other Ambulatory Visit (HOSPITAL_COMMUNITY): Payer: Self-pay

## 2023-10-20 MED ORDER — EMBECTA PEN NEEDLE NANO 32G X 4 MM MISC
Freq: Every day | 3 refills | Status: AC
  Filled 2023-10-20: qty 100, 100d supply, fill #0
  Filled 2024-01-21: qty 100, 100d supply, fill #1

## 2023-11-02 ENCOUNTER — Other Ambulatory Visit (HOSPITAL_COMMUNITY): Payer: Self-pay

## 2023-11-02 MED ORDER — MOUNJARO 10 MG/0.5ML ~~LOC~~ SOAJ
10.0000 mg | SUBCUTANEOUS | 3 refills | Status: DC
  Filled 2023-11-02: qty 2, 28d supply, fill #0
  Filled 2023-11-26: qty 2, 28d supply, fill #1
  Filled 2023-12-24: qty 2, 28d supply, fill #2
  Filled 2024-01-21: qty 2, 28d supply, fill #3

## 2023-11-17 DIAGNOSIS — E119 Type 2 diabetes mellitus without complications: Secondary | ICD-10-CM | POA: Diagnosis not present

## 2023-11-22 DIAGNOSIS — K080 Exfoliation of teeth due to systemic causes: Secondary | ICD-10-CM | POA: Diagnosis not present

## 2023-11-27 ENCOUNTER — Other Ambulatory Visit (HOSPITAL_COMMUNITY): Payer: Self-pay

## 2023-11-29 ENCOUNTER — Other Ambulatory Visit (HOSPITAL_COMMUNITY): Payer: Self-pay

## 2023-11-29 MED ORDER — DAPAGLIFLOZIN PROPANEDIOL 10 MG PO TABS
10.0000 mg | ORAL_TABLET | Freq: Every day | ORAL | 5 refills | Status: AC
  Filled 2023-11-29: qty 30, 30d supply, fill #0
  Filled 2023-12-25: qty 30, 30d supply, fill #1
  Filled 2024-01-22: qty 30, 30d supply, fill #2
  Filled 2024-02-18 – 2024-02-20 (×2): qty 30, 30d supply, fill #3

## 2023-12-07 ENCOUNTER — Other Ambulatory Visit: Payer: Self-pay

## 2023-12-07 ENCOUNTER — Other Ambulatory Visit (HOSPITAL_COMMUNITY): Payer: Self-pay

## 2023-12-18 DIAGNOSIS — E119 Type 2 diabetes mellitus without complications: Secondary | ICD-10-CM | POA: Diagnosis not present

## 2023-12-19 ENCOUNTER — Other Ambulatory Visit (HOSPITAL_COMMUNITY): Payer: Self-pay

## 2023-12-20 ENCOUNTER — Other Ambulatory Visit (HOSPITAL_COMMUNITY): Payer: Self-pay

## 2023-12-20 MED ORDER — GABAPENTIN 300 MG PO CAPS
300.0000 mg | ORAL_CAPSULE | Freq: Two times a day (BID) | ORAL | 1 refills | Status: DC
  Filled 2023-12-20: qty 60, 30d supply, fill #0
  Filled 2024-01-21: qty 60, 30d supply, fill #1

## 2023-12-25 ENCOUNTER — Other Ambulatory Visit (HOSPITAL_COMMUNITY): Payer: Self-pay

## 2023-12-27 ENCOUNTER — Other Ambulatory Visit: Payer: Self-pay

## 2023-12-27 ENCOUNTER — Other Ambulatory Visit (HOSPITAL_COMMUNITY): Payer: Self-pay

## 2023-12-27 MED ORDER — METFORMIN HCL ER 500 MG PO TB24
500.0000 mg | ORAL_TABLET | Freq: Two times a day (BID) | ORAL | 3 refills | Status: AC
  Filled 2023-12-27: qty 180, 90d supply, fill #0

## 2024-01-03 DIAGNOSIS — E785 Hyperlipidemia, unspecified: Secondary | ICD-10-CM | POA: Diagnosis not present

## 2024-01-21 ENCOUNTER — Other Ambulatory Visit: Payer: Self-pay

## 2024-01-27 NOTE — Progress Notes (Signed)
 [facility] OFFICE PROGRESS NOTE  Arloa Elsie SAUNDERS, MD [address] [place] [address]  DIAGNOSIS: Monoclonal B-cell lymphocytosis suspicious for CLL diagnosed in July 2023.   PRIOR THERAPY: None  CURRENT THERAPY: Observation   INTERVAL HISTORY: Mike Taylor 68 y.o. male returns to the clinic today for a 1-month follow-up visit.  The patient is followed for CLL.  The patient is currently on observation and feeling well. The patient denies any changes in his health since he was last seen 6 months ago.  He denies any fever, chills, or night sweats. He lost a few pounds which he attributes to his diabetes medication which causes bowel habit changes. He denies any lymphadenopathy.  He denies any signs and symptoms of infection including  sore throat, skin infections, dysuria, nausea, or unusual diarrhea. He sometimes gets lightheaded which he attributes to his medications. He denies any abnormal bleeding or bruising.  He is here today for evaluation and repeat blood work.    MEDICAL HISTORY: Past Medical History:  Diagnosis Date   Diabetes mellitus without complication (HCC)    Hypercholesterolemia    Hypertension     ALLERGIES:  has no known allergies.  MEDICATIONS:  Current Outpatient Medications  Medication Sig Dispense Refill   aspirin EC 81 MG tablet Take 81 mg by mouth daily with breakfast.     atorvastatin  (LIPITOR) 80 MG tablet Take 1 tablet (80 mg total) by mouth daily. 90 tablet 3   atorvastatin  (LIPITOR) 80 MG tablet Take 1 tablet (80 mg total) by mouth daily. 90 tablet 3   dapagliflozin  propanediol (FARXIGA ) 10 MG TABS tablet Take 1 tablet (10 mg total) by mouth daily. 30 tablet 5   Dulaglutide  (TRULICITY ) 4.5 MG/0.5ML SOPN Inject 4.5 mg into the skin once a week 2 mL 2   gabapentin  (NEURONTIN ) 300 MG capsule Take 300 mg by mouth 3 (three) times daily.     gabapentin  (NEURONTIN ) 300 MG capsule Take 1 capsule (300 mg total) by mouth 2  (two) times daily. 60 capsule 1   glipiZIDE (GLUCOTROL XL) 2.5 MG 24 hr tablet Take 2.5 mg by mouth every morning. (Patient not taking: Reported on 07/26/2023)     insulin  glargine, 1 Unit Dial , (TOUJEO ) 300 UNIT/ML Solostar Pen Inject 22 units into the skin every evening. 4.5 mL 5   insulin  glargine, 1 Unit Dial , (TOUJEO ) 300 UNIT/ML Solostar Pen Inject 18 Units into the skin every evening. 3 mL 5   Insulin  Pen Needle (EMBECTA PEN NEEDLE NANO) 32G X 4 MM MISC use once daily. 100 each 3   lansoprazole  (PREVACID ) 30 MG capsule Take 30 mg by mouth daily with breakfast.     lansoprazole  (PREVACID ) 30 MG capsule Take 1 capsule (30 mg total) by mouth daily. 90 capsule 3   losartan -hydrochlorothiazide  (HYZAAR ) 100-25 MG tablet Take 1 tablet by mouth daily. 90 tablet 1   losartan -hydrochlorothiazide  (HYZAAR ) 100-25 MG tablet Take 1 tablet by mouth daily. 90 tablet 3   losartan -hydrochlorothiazide  (HYZAAR ) 50-12.5 MG tablet Take 1 tablet by mouth daily.     metFORMIN  (GLUCOPHAGE ) 500 MG tablet Take 500 mg by mouth 2 (two) times daily with a meal.     metFORMIN  (GLUCOPHAGE -XR) 500 MG 24 hr tablet Take 2 tablets (1,000 mg total) by mouth every evening. 60 tablet 0   metFORMIN  (GLUCOPHAGE -XR) 500 MG 24 hr tablet Take 2 tablets (1,000 mg total) by mouth every evening. 180 tablet 1   metFORMIN  (GLUCOPHAGE -XR) 500  MG 24 hr tablet Take 1 tablet (500 mg total) by mouth 2 (two) times daily. 180 tablet 3   metoprolol  succinate (TOPROL -XL) 100 MG 24 hr tablet TAKE 1 TABLET DAILY TAKE   WITH OR IMMEDIATELY        FOLLOWING A MEAL 90 tablet 3   metoprolol  succinate (TOPROL -XL) 50 MG 24 hr tablet Take 1 tablet (50 mg total) by mouth daily. 90 tablet 1   metoprolol  succinate (TOPROL -XL) 50 MG 24 hr tablet Take 1 tablet (50 mg total) by mouth daily. 90 tablet 3   metoprolol  succinate (TOPROL -XL) 50 MG 24 hr tablet Take 1 tablet (50 mg total) by mouth daily. 90 tablet 3   pioglitazone (ACTOS) 30 MG tablet Take 15 mg by mouth  daily. 1/2 tablet     pregabalin  (LYRICA ) 100 MG capsule Take 100 mg by mouth daily.     pregabalin  (LYRICA ) 100 MG capsule Take 1 capsule (100 mg total) by mouth daily. 90 capsule 1   pregabalin  (LYRICA ) 100 MG capsule Take 1 capsule (100 mg total) by mouth at bedtime. 90 capsule 3   terazosin  (HYTRIN ) 5 MG capsule Take 5 mg by mouth daily with breakfast.     terazosin  (HYTRIN ) 5 MG capsule Take 1 capsule (5 mg total) by mouth daily. 90 capsule 3   tirzepatide  (MOUNJARO ) 10 MG/0.5ML Pen Inject 10 mg into the skin once a week. 2 mL 3   tirzepatide  (MOUNJARO ) 2.5 MG/0.5ML Pen Inject 2.5 mg into the skin once a week. 2 mL 1   No current facility-administered medications for this visit.   Facility-Administered Medications Ordered in Other Visits  Medication Dose Route Frequency Provider Last Rate Last Admin   regadenoson  (LEXISCAN ) injection SOLN 0.4 mg  0.4 mg Intravenous Once Okey Vina GAILS, MD        SURGICAL HISTORY: No past surgical history on file.  REVIEW OF SYSTEMS:   Review of Systems  Constitutional: Positive for weight loss. Negative for appetite change, chills, fatigue, fever.  HENT:   Negative for mouth sores, nosebleeds, sore throat and trouble swallowing.   Eyes: Negative for eye problems and icterus.  Respiratory: Negative for cough, hemoptysis, shortness of breath and wheezing.   Cardiovascular: Negative for chest pain and leg swelling.  Gastrointestinal: Negative for abdominal pain, constipation, diarrhea, nausea and vomiting.  Genitourinary: Negative for bladder incontinence, difficulty urinating, dysuria, frequency and hematuria.   Musculoskeletal: Negative for back pain, gait problem, neck pain and neck stiffness.  Skin: Negative for itching and rash.  Neurological: Occasional lightheadedness. Negative for dizziness, extremity weakness, gait problem, headaches, and seizures.  Hematological: Negative for adenopathy. Does not bruise/bleed easily.  Psychiatric/Behavioral:  Negative for confusion, depression and sleep disturbance. The patient is not nervous/anxious.     PHYSICAL EXAMINATION:  Blood pressure 132/60, pulse 70, temperature 98.1 F (36.7 C), temperature source Temporal, resp. rate 15, weight 217 lb 4.8 oz (98.6 kg), SpO2 100%.  ECOG PERFORMANCE STATUS: 0  Physical Exam  Constitutional: Oriented to person, place, and time and well-developed, well-nourished, and in no distress.  HENT:  Head: Normocephalic and atraumatic.  Mouth/Throat: Oropharynx is clear and moist. No oropharyngeal exudate.  Eyes: Conjunctivae are normal. Right eye exhibits no discharge. Left eye exhibits no discharge. No scleral icterus.  Neck: Normal range of motion. Neck supple.  Cardiovascular: Normal rate, regular rhythm, normal heart sounds and intact distal pulses.   Pulmonary/Chest: Effort normal and breath sounds normal. No respiratory distress. No wheezes. No rales.  Abdominal:  Soft. Bowel sounds are normal. Exhibits no distension and no mass. There is no tenderness.  Musculoskeletal: Normal range of motion. Exhibits no edema.  Lymphadenopathy:    No cervical adenopathy.  Neurological: Alert and oriented to person, place, and time. Exhibits normal muscle tone. Gait normal. Coordination normal.  Skin: Skin is warm and dry. No rash noted. Not diaphoretic. No erythema. No pallor.  Psychiatric: Mood, memory and judgment normal.  Vitals reviewed.  LABORATORY DATA: Lab Results  Component Value Date   WBC 19.9 (H) 01/31/2024   HGB 14.1 01/31/2024   HCT 40.3 01/31/2024   MCV 82.1 01/31/2024   PLT 277 01/31/2024      Chemistry      Component Value Date/Time   NA 137 07/26/2023 0807   K 4.3 07/26/2023 0807   CL 104 07/26/2023 0807   CO2 27 07/26/2023 0807   BUN 28 (H) 07/26/2023 0807   CREATININE 0.96 07/26/2023 0807   CREATININE 0.84 04/01/2015 0802      Component Value Date/Time   CALCIUM  9.6 07/26/2023 0807   ALKPHOS 62 07/26/2023 0807   AST 23  07/26/2023 0807   ALT 30 07/26/2023 0807   BILITOT 0.7 07/26/2023 0807       RADIOGRAPHIC STUDIES:  No results found.   ASSESSMENT/PLAN:  This is a very pleasant 68 year old Caucasian male with Monoclonal B-cell lymphocytosis suspicious for CLL diagnosed in July 2023. The patient is currently on observation and feeling fine.   The patient was seen with Dr. Sherrod. The patient had a CBC performed today which shows WBC of 19.9. With his WBC of 19k, would recommend close monitoring for now.    We recommend he continue on observation with repeat labs in 6 months.  The patient was advised to call immediately if he has any concerning symptoms in the interval. The patient voices understanding of current disease status and treatment options and is in agreement with the current care plan. All questions were answered. The patient knows to call the clinic with any problems, questions or concerns. We can certainly see the patient much sooner if necessary    Orders Placed This Encounter  Procedures   CBC with Differential (Cancer Center Only)    Standing Status:   Future    Expected Date:   07/31/2024    Expiration Date:   01/30/2025   CMP (Cancer Center only)    Standing Status:   Future    Expected Date:   07/31/2024    Expiration Date:   01/30/2025   Lactate dehydrogenase (LDH)    Standing Status:   Future    Expected Date:   07/31/2024    Expiration Date:   01/30/2025     Marylon Verno L Denaisha Swango, PA-C 01/31/2024  ADDENDUM: Hematology/Oncology Attending: I had a face-to-face encounter with the patient today.  I reviewed his record, lab and recommended his care plan.  This is a very pleasant 68 years old white male with CLL diagnosed in July 2023 and currently on observation.  The patient is feeling fine today with no concerning complaints.  He denied having any recent weight loss or night sweats.  He has no headache visual changes or palpable lymphadenopathy. Repeat blood work  today showed white blood count of 19.9 with absolute lymphocyte count of 7800. I recommended for the patient to continue on observation with repeat blood work in 6 months. He was advised to call immediately if he has any other concerning symptoms in the interval. Disclaimer: This note  was dictated with voice recognition software. Similar sounding words can inadvertently be transcribed and may be missed upon review. Sherrod MARLA Sherrod, MD

## 2024-01-31 ENCOUNTER — Inpatient Hospital Stay: Attending: Physician Assistant

## 2024-01-31 ENCOUNTER — Inpatient Hospital Stay: Admitting: Physician Assistant

## 2024-01-31 VITALS — BP 132/60 | HR 70 | Temp 98.1°F | Resp 15 | Wt 217.3 lb

## 2024-01-31 DIAGNOSIS — Z794 Long term (current) use of insulin: Secondary | ICD-10-CM | POA: Diagnosis not present

## 2024-01-31 DIAGNOSIS — Z7985 Long-term (current) use of injectable non-insulin antidiabetic drugs: Secondary | ICD-10-CM | POA: Diagnosis not present

## 2024-01-31 DIAGNOSIS — E119 Type 2 diabetes mellitus without complications: Secondary | ICD-10-CM | POA: Insufficient documentation

## 2024-01-31 DIAGNOSIS — C9110 Chronic lymphocytic leukemia of B-cell type not having achieved remission: Secondary | ICD-10-CM | POA: Diagnosis not present

## 2024-01-31 DIAGNOSIS — Z7984 Long term (current) use of oral hypoglycemic drugs: Secondary | ICD-10-CM | POA: Diagnosis not present

## 2024-01-31 DIAGNOSIS — I10 Essential (primary) hypertension: Secondary | ICD-10-CM | POA: Diagnosis not present

## 2024-01-31 DIAGNOSIS — Z79899 Other long term (current) drug therapy: Secondary | ICD-10-CM | POA: Diagnosis not present

## 2024-01-31 DIAGNOSIS — D72820 Lymphocytosis (symptomatic): Secondary | ICD-10-CM

## 2024-01-31 DIAGNOSIS — E7800 Pure hypercholesterolemia, unspecified: Secondary | ICD-10-CM | POA: Diagnosis not present

## 2024-01-31 DIAGNOSIS — Z7982 Long term (current) use of aspirin: Secondary | ICD-10-CM | POA: Diagnosis not present

## 2024-01-31 LAB — CBC WITH DIFFERENTIAL (CANCER CENTER ONLY)
Abs Immature Granulocytes: 0.08 K/uL — ABNORMAL HIGH (ref 0.00–0.07)
Basophils Absolute: 0.1 K/uL (ref 0.0–0.1)
Basophils Relative: 1 %
Eosinophils Absolute: 0.3 K/uL (ref 0.0–0.5)
Eosinophils Relative: 2 %
HCT: 40.3 % (ref 39.0–52.0)
Hemoglobin: 14.1 g/dL (ref 13.0–17.0)
Immature Granulocytes: 0 %
Lymphocytes Relative: 39 %
Lymphs Abs: 7.8 K/uL — ABNORMAL HIGH (ref 0.7–4.0)
MCH: 28.7 pg (ref 26.0–34.0)
MCHC: 35 g/dL (ref 30.0–36.0)
MCV: 82.1 fL (ref 80.0–100.0)
Monocytes Absolute: 1.1 K/uL — ABNORMAL HIGH (ref 0.1–1.0)
Monocytes Relative: 5 %
Neutro Abs: 10.4 K/uL — ABNORMAL HIGH (ref 1.7–7.7)
Neutrophils Relative %: 53 %
Platelet Count: 277 K/uL (ref 150–400)
RBC: 4.91 MIL/uL (ref 4.22–5.81)
RDW: 13.2 % (ref 11.5–15.5)
WBC Count: 19.9 K/uL — ABNORMAL HIGH (ref 4.0–10.5)
nRBC: 0 % (ref 0.0–0.2)

## 2024-01-31 LAB — LACTATE DEHYDROGENASE: LDH: 187 U/L (ref 105–235)

## 2024-01-31 LAB — CMP (CANCER CENTER ONLY)
ALT: 40 U/L (ref 0–44)
AST: 27 U/L (ref 15–41)
Albumin: 4.6 g/dL (ref 3.5–5.0)
Alkaline Phosphatase: 72 U/L (ref 38–126)
Anion gap: 11 (ref 5–15)
BUN: 20 mg/dL (ref 8–23)
CO2: 27 mmol/L (ref 22–32)
Calcium: 10.1 mg/dL (ref 8.9–10.3)
Chloride: 101 mmol/L (ref 98–111)
Creatinine: 0.89 mg/dL (ref 0.61–1.24)
GFR, Estimated: >60 mL/min (ref >60)
Glucose, Bld: 180 mg/dL — ABNORMAL HIGH (ref 70–99)
Potassium: 4.2 mmol/L (ref 3.5–5.1)
Sodium: 139 mmol/L (ref 135–145)
Total Bilirubin: 0.8 mg/dL (ref 0.0–1.2)
Total Protein: 7.4 g/dL (ref 6.5–8.1)

## 2024-02-01 ENCOUNTER — Telehealth: Payer: Self-pay | Admitting: Physician Assistant

## 2024-02-01 NOTE — Telephone Encounter (Signed)
 Scheduled patient for labs and follow-up in 6 months. Called and spoke with the patient, he is aware of appointments.

## 2024-02-18 ENCOUNTER — Other Ambulatory Visit (HOSPITAL_COMMUNITY): Payer: Self-pay

## 2024-02-18 ENCOUNTER — Other Ambulatory Visit: Payer: Self-pay

## 2024-02-18 MED ORDER — GABAPENTIN 300 MG PO CAPS
300.0000 mg | ORAL_CAPSULE | Freq: Two times a day (BID) | ORAL | 1 refills | Status: AC
  Filled 2024-02-18 – 2024-02-22 (×2): qty 60, 30d supply, fill #0
  Filled 2024-03-04 – 2024-03-20 (×2): qty 60, 30d supply, fill #1

## 2024-02-18 MED ORDER — MOUNJARO 10 MG/0.5ML ~~LOC~~ SOAJ
10.0000 mg | SUBCUTANEOUS | 3 refills | Status: AC
  Filled 2024-02-18: qty 2, 28d supply, fill #0
  Filled 2024-03-17: qty 2, 28d supply, fill #1

## 2024-02-19 ENCOUNTER — Other Ambulatory Visit (HOSPITAL_COMMUNITY): Payer: Self-pay

## 2024-02-22 ENCOUNTER — Other Ambulatory Visit (HOSPITAL_COMMUNITY): Payer: Self-pay

## 2024-02-22 ENCOUNTER — Other Ambulatory Visit: Payer: Self-pay

## 2024-02-28 ENCOUNTER — Other Ambulatory Visit: Payer: Self-pay

## 2024-02-28 ENCOUNTER — Other Ambulatory Visit (HOSPITAL_COMMUNITY): Payer: Self-pay

## 2024-03-04 ENCOUNTER — Other Ambulatory Visit (HOSPITAL_COMMUNITY): Payer: Self-pay

## 2024-03-06 ENCOUNTER — Other Ambulatory Visit: Payer: Self-pay

## 2024-03-07 ENCOUNTER — Other Ambulatory Visit: Payer: Self-pay

## 2024-03-07 ENCOUNTER — Other Ambulatory Visit (HOSPITAL_COMMUNITY): Payer: Self-pay

## 2024-03-15 ENCOUNTER — Other Ambulatory Visit: Payer: Self-pay

## 2024-03-17 ENCOUNTER — Other Ambulatory Visit: Payer: Self-pay

## 2024-03-20 ENCOUNTER — Other Ambulatory Visit: Payer: Self-pay

## 2024-03-21 ENCOUNTER — Other Ambulatory Visit: Payer: Self-pay

## 2024-03-21 ENCOUNTER — Other Ambulatory Visit (HOSPITAL_COMMUNITY): Payer: Self-pay

## 2024-03-21 MED ORDER — MOUNJARO 12.5 MG/0.5ML ~~LOC~~ SOAJ
12.5000 mg | SUBCUTANEOUS | 3 refills | Status: AC
  Filled 2024-03-21: qty 2, 28d supply, fill #0

## 2024-03-24 ENCOUNTER — Other Ambulatory Visit (HOSPITAL_COMMUNITY): Payer: Self-pay

## 2024-07-31 ENCOUNTER — Inpatient Hospital Stay: Admitting: Physician Assistant

## 2024-07-31 ENCOUNTER — Inpatient Hospital Stay
